# Patient Record
Sex: Male | Born: 1980 | Race: Black or African American | Hispanic: No | Marital: Single | Smoking: Never smoker
Health system: Southern US, Community
[De-identification: ages and names within clinical notes are randomized; demographics above are authoritative.]

## PROBLEM LIST (undated history)

## (undated) DIAGNOSIS — F1910 Other psychoactive substance abuse, uncomplicated: Secondary | ICD-10-CM

## (undated) DIAGNOSIS — F319 Bipolar disorder, unspecified: Secondary | ICD-10-CM

## (undated) DIAGNOSIS — F419 Anxiety disorder, unspecified: Secondary | ICD-10-CM

## (undated) DIAGNOSIS — F329 Major depressive disorder, single episode, unspecified: Secondary | ICD-10-CM

## (undated) DIAGNOSIS — K219 Gastro-esophageal reflux disease without esophagitis: Secondary | ICD-10-CM

## (undated) DIAGNOSIS — F32A Depression, unspecified: Secondary | ICD-10-CM

## (undated) HISTORY — PX: KNEE SURGERY: SHX244

## (undated) HISTORY — PX: ANKLE SURGERY: SHX546

## (undated) HISTORY — PX: KNEE ARTHROPLASTY: SHX992

---

## 2012-08-30 ENCOUNTER — Emergency Department: Payer: Self-pay | Admitting: Emergency Medicine

## 2012-11-18 ENCOUNTER — Emergency Department (HOSPITAL_COMMUNITY): Payer: Self-pay

## 2012-11-18 ENCOUNTER — Encounter (HOSPITAL_COMMUNITY): Payer: Self-pay | Admitting: Emergency Medicine

## 2012-11-18 ENCOUNTER — Emergency Department (HOSPITAL_COMMUNITY)
Admission: EM | Admit: 2012-11-18 | Discharge: 2012-11-18 | Disposition: A | Discharge status: Home / Self Care | Payer: Self-pay | Attending: Emergency Medicine | Admitting: Emergency Medicine

## 2012-11-18 DIAGNOSIS — S61259A Open bite of unspecified finger without damage to nail, initial encounter: Secondary | ICD-10-CM

## 2012-11-18 DIAGNOSIS — W540XXA Bitten by dog, initial encounter: Secondary | ICD-10-CM | POA: Insufficient documentation

## 2012-11-18 DIAGNOSIS — Y9289 Other specified places as the place of occurrence of the external cause: Secondary | ICD-10-CM | POA: Insufficient documentation

## 2012-11-18 DIAGNOSIS — S61209A Unspecified open wound of unspecified finger without damage to nail, initial encounter: Secondary | ICD-10-CM | POA: Insufficient documentation

## 2012-11-18 DIAGNOSIS — Y9389 Activity, other specified: Secondary | ICD-10-CM | POA: Insufficient documentation

## 2012-11-18 DIAGNOSIS — Y990 Civilian activity done for income or pay: Secondary | ICD-10-CM | POA: Insufficient documentation

## 2012-11-18 MED ORDER — HYDROCODONE-ACETAMINOPHEN 5-325 MG PO TABS: 1.0000 | ORAL_TABLET | ORAL | Status: DC | PRN

## 2012-11-18 MED ORDER — AMOXICILLIN-POT CLAVULANATE 875-125 MG PO TABS
1.0000 | ORAL_TABLET | Freq: Once | ORAL | Status: AC
  Administered 2012-11-18: 1 via ORAL
  Filled 2012-11-18: qty 1

## 2012-11-18 MED ORDER — HYDROCODONE-ACETAMINOPHEN 5-325 MG PO TABS
1.0000 | ORAL_TABLET | Freq: Once | ORAL | Status: AC
  Administered 2012-11-18: 1 via ORAL
  Filled 2012-11-18: qty 1

## 2012-11-18 MED ORDER — AMOXICILLIN-POT CLAVULANATE 875-125 MG PO TABS: 1.0000 | ORAL_TABLET | Freq: Two times a day (BID) | ORAL | Status: DC

## 2012-11-18 NOTE — ED Notes (Signed)
PT. REPORTS DOG BITE TO RIGHT LATERAL MIDDLE FINGER TODAY AT AN [employer] WHERE HE WORKS , UNSURE IF DOG IS IMMUNIZED , NO BLEEDING . SLIGHT SWELLING .

## 2012-11-18 NOTE — Progress Notes (Signed)
Orthopedic Tech Progress Note Patient Details:  Gabriel Barnes Jan 09, 1981 [id number]  Patient ID: Gabriel Barnes, male   DOB: [birth year], 31 y.o.   MRN: [id number] Finger splint order cancelled at request of doctor.  Nikki Dom 11/18/2012, 8:46 PM

## 2012-11-18 NOTE — ED Provider Notes (Signed)
History     CSN: [id number]  Arrival date & time 11/18/12  1907   First MD Initiated Contact with Patient 11/18/12 2005      Chief Complaint  Patient presents with  . Animal Bite    (Consider location/radiation/quality/duration/timing/severity/associated sxs/prior treatment) HPI History provided by pt.   Pt was bitten by a dog on right middle finger at animal [facility], where he works, today.  Has 8/10 pain.  Has applied antibiotic ointment.  Most recent tetanus 3 years ago.  Dog currently quarantined though he is not sure of his vaccination status.   History reviewed. No pertinent past medical history.  Past Surgical History  Procedure Date  . Knee arthroplasty   . Ankle surgery     No family history on file.  History  Substance Use Topics  . Smoking status: Never Smoker   . Smokeless tobacco: Not on file  . Alcohol Use: No      Review of Systems  All other systems reviewed and are negative.    Allergies  Review of patient's allergies indicates no known allergies.  Home Medications   Current Outpatient Rx  Name  Route  Sig  Dispense  Refill  . AMOXICILLIN-POT CLAVULANATE 875-125 MG PO TABS   Oral   Take 1 tablet by mouth 2 (two) times daily.   13 tablet   0   . HYDROCODONE-ACETAMINOPHEN 5-325 MG PO TABS   Oral   Take 1 tablet by mouth every 4 (four) hours as needed for pain.   20 tablet   0     BP 120/85  Pulse 85  Temp 97.9 F (36.6 C) (Oral)  Resp 18  SpO2 100%  Physical Exam  Nursing note and vitals reviewed. Constitutional: He is oriented to person, place, and time. He appears well-developed and well-nourished. No distress.  HENT:  Head: Normocephalic and atraumatic.  Eyes:       Normal appearance  Neck: Normal range of motion.  Pulmonary/Chest: Effort normal.  Musculoskeletal: Normal range of motion.       subq puncture wound right middle finger at lateral aspect of distal proximal phalanx.  No edema,. erythema or drainage.  Pain w/  ROM of PIP joint.  Distal sensation intact.   Neurological: He is alert and oriented to person, place, and time.  Psychiatric: He has a normal mood and affect. His behavior is normal.    ED Course  Procedures (including critical care time)  Labs Reviewed - No data to display Dg Finger Middle Right  11/18/2012  *RADIOLOGY REPORT*  Clinical Data: Dog bite right middle finger  RIGHT MIDDLE FINGER 2+V  Comparison: None.  Findings: No evidence of fracture, dislocation or radiopaque foreign object.  There is a small periarticular calcification dorsal to the DIP joint, not acute.  IMPRESSION: No acute finding by radiography.   Original Report Authenticated By: Paulina Fusi, M.D.      1. Animal bite of finger       MDM  Healthy 31yo M presents w/ animal bite to right middle finger that occurred at work.  Vaccination status of animal unknown but he is currently being quarantined.  Wound cleaned and wrapped in gauze by nursing staff.  Tetanus is up to date.  Pt received one dose of augmentin and d/c'd home w/ same + vicodin for pain.  Strict return precautions discussed.          Otilio Miu, PA-C 11/19/12 0007  Otilio Miu, PA-C 11/19/12 [phone]

## 2012-11-18 NOTE — ED Notes (Signed)
PT works at the [employer] and was bit by a  Dog at [employer] . The dog being held with  possible Rabies.

## 2012-11-18 NOTE — ED Notes (Signed)
Pt has swelling to middle finger of right hand after chihuahua bit him. Pt requesting rabies shot. A&Ox4, ambulatory, nad.

## 2012-11-18 NOTE — ED Notes (Signed)
Pt wound cleaned with dermal wound cleanser spray. Affected middle finger wrapped with gauze.

## 2012-11-19 NOTE — ED Provider Notes (Signed)
Medical screening examination/treatment/procedure(s) were performed by non-physician practitioner and as supervising physician I was immediately available for consultation/collaboration.  Flint Melter, MD 11/19/12 1215

## 2012-12-02 ENCOUNTER — Encounter (HOSPITAL_COMMUNITY): Payer: Self-pay | Admitting: *Deleted

## 2012-12-02 ENCOUNTER — Emergency Department (HOSPITAL_COMMUNITY)
Admission: EM | Admit: 2012-12-02 | Discharge: 2012-12-02 | Disposition: A | Discharge status: Home / Self Care | Payer: Self-pay | Attending: Emergency Medicine | Admitting: Emergency Medicine

## 2012-12-02 DIAGNOSIS — IMO0001 Reserved for inherently not codable concepts without codable children: Secondary | ICD-10-CM | POA: Insufficient documentation

## 2012-12-02 DIAGNOSIS — R51 Headache: Secondary | ICD-10-CM | POA: Insufficient documentation

## 2012-12-02 DIAGNOSIS — Z79899 Other long term (current) drug therapy: Secondary | ICD-10-CM | POA: Insufficient documentation

## 2012-12-02 DIAGNOSIS — R0982 Postnasal drip: Secondary | ICD-10-CM | POA: Insufficient documentation

## 2012-12-02 DIAGNOSIS — J069 Acute upper respiratory infection, unspecified: Secondary | ICD-10-CM | POA: Insufficient documentation

## 2012-12-02 DIAGNOSIS — J3489 Other specified disorders of nose and nasal sinuses: Secondary | ICD-10-CM | POA: Insufficient documentation

## 2012-12-02 DIAGNOSIS — R5383 Other fatigue: Secondary | ICD-10-CM | POA: Insufficient documentation

## 2012-12-02 DIAGNOSIS — R5381 Other malaise: Secondary | ICD-10-CM | POA: Insufficient documentation

## 2012-12-02 DIAGNOSIS — R0981 Nasal congestion: Secondary | ICD-10-CM

## 2012-12-02 DIAGNOSIS — Z792 Long term (current) use of antibiotics: Secondary | ICD-10-CM | POA: Insufficient documentation

## 2012-12-02 MED ORDER — IBUPROFEN 400 MG PO TABS
800.0000 mg | ORAL_TABLET | Freq: Once | ORAL | Status: AC
  Administered 2012-12-02: 800 mg via ORAL
  Filled 2012-12-02: qty 2

## 2012-12-02 MED ORDER — GUAIFENESIN ER 600 MG PO TB12: 1200.0000 mg | ORAL_TABLET | Freq: Two times a day (BID) | ORAL | Status: DC

## 2012-12-02 MED ORDER — IPRATROPIUM BROMIDE 0.03 % NA SOLN: 2.0000 | Freq: Two times a day (BID) | NASAL | Status: DC

## 2012-12-02 NOTE — ED Notes (Signed)
Patient reports onset of cold sx and he cannot sleep for a couple of days.  He states he has nasal congestion/dryness and he cannot breathe.  Patient denies cough.  Patient denies fever.  Patient states he is having body aches.  Patient tried dayquil w/o relief.  He denies sore throat

## 2012-12-02 NOTE — ED Provider Notes (Signed)
Medical screening examination/treatment/procedure(s) were performed by non-physician practitioner and as supervising physician I was immediately available for consultation/collaboration.   Monta Police, MD 12/02/12 1656 

## 2012-12-02 NOTE — ED Provider Notes (Signed)
History     CSN: [id number]  Arrival date & time 12/02/12  0911   First MD Initiated Contact with Patient 12/02/12 [phone]      Chief Complaint  Patient presents with  . URI    (Consider location/radiation/quality/duration/timing/severity/associated sxs/prior treatment) The history is provided by the patient and medical records.    Gabriel Barnes is a 32 y.o. male  With no known medical hx presents to the Emergency Department complaining of gradual, persistent, progressively worsening nasal congestion and headache onset 3 days ago.  Pt states he has been around people who have a "head cold" and he thinks he caught it.  Associated symptoms include headache, difficulty breathing through his nose, difficulty sleeping, myalgias, mild diarrhea.  Nothing makes it better and nothing makes it worse.  Pt denies fever, chills, sore throat, chest congestion, cough, abdominal pain, nausea, vomiting, weakness, dizziness, syncope, dysuria.      History reviewed. No pertinent past medical history.  Past Surgical History  Procedure Date  . Knee arthroplasty   . Ankle surgery     No family history on file.  History  Substance Use Topics  . Smoking status: Never Smoker   . Smokeless tobacco: Not on file  . Alcohol Use: No      Review of Systems  Constitutional: Positive for fatigue. Negative for fever, chills and appetite change.  HENT: Positive for congestion and sinus pressure. Negative for sore throat, rhinorrhea, mouth sores, neck stiffness and postnasal drip.   Eyes: Negative for visual disturbance.  Respiratory: Negative for cough, chest tightness, shortness of breath, wheezing and stridor.   Cardiovascular: Negative for chest pain, palpitations and leg swelling.  Gastrointestinal: Negative for nausea, vomiting, abdominal pain and diarrhea.  Genitourinary: Negative for dysuria, urgency, frequency and hematuria.  Musculoskeletal: Positive for myalgias. Negative for back pain and  arthralgias.  Skin: Negative for rash.  Neurological: Positive for headaches. Negative for syncope, light-headedness and numbness.  Hematological: Negative for adenopathy.  Psychiatric/Behavioral: The patient is not nervous/anxious.   All other systems reviewed and are negative.    Allergies  Review of patient's allergies indicates no known allergies.  Home Medications   Current Outpatient Rx  Name  Route  Sig  Dispense  Refill  . AMOXICILLIN-POT CLAVULANATE 875-125 MG PO TABS   Oral   Take 1 tablet by mouth 2 (two) times daily.   13 tablet   0   . ADULT MULTIVITAMIN W/MINERALS CH   Oral   Take 1 tablet by mouth daily.         . DAYQUIL PO   Oral   Take by mouth.         . GUAIFENESIN ER 600 MG PO TB12   Oral   Take 2 tablets (1,200 mg total) by mouth 2 (two) times daily.   20 tablet   0   . IPRATROPIUM BROMIDE 0.03 % NA SOLN   Nasal   Place 2 sprays into the nose every 12 (twelve) hours.   30 mL   12     BP 115/74  Pulse 64  Temp 97 F (36.1 C) (Oral)  Resp 16  Ht 6\' 1"  (1.854 m)  Wt 265 lb (120.203 kg)  BMI 34.96 kg/m2  SpO2 97%  Physical Exam  Nursing note and vitals reviewed. Constitutional: He is oriented to person, place, and time. He appears well-developed and well-nourished. No distress.  HENT:  Head: Normocephalic and atraumatic.  Right Ear: Tympanic membrane, external ear and ear  canal normal.  Left Ear: Tympanic membrane, external ear and ear canal normal.  Nose: Mucosal edema and rhinorrhea present. No epistaxis. Right sinus exhibits no maxillary sinus tenderness and no frontal sinus tenderness. Left sinus exhibits no maxillary sinus tenderness and no frontal sinus tenderness.  Mouth/Throat: Uvula is midline, oropharynx is clear and moist and mucous membranes are normal. Mucous membranes are not pale and not cyanotic. No oropharyngeal exudate, posterior oropharyngeal edema, posterior oropharyngeal erythema or tonsillar abscesses.  Eyes:  Conjunctivae normal are normal. Pupils are equal, round, and reactive to light.  Neck: Normal range of motion and full passive range of motion without pain.  Cardiovascular: Normal rate, normal heart sounds and intact distal pulses.  Exam reveals no gallop and no friction rub.   No murmur heard. Pulmonary/Chest: Effort normal and breath sounds normal. No stridor. No respiratory distress. He has no wheezes. He has no rales. He exhibits no tenderness.  Abdominal: Soft. Bowel sounds are normal. He exhibits no distension and no mass. There is no tenderness. There is no rebound and no guarding.  Musculoskeletal: Normal range of motion. He exhibits no tenderness.  Lymphadenopathy:    He has no cervical adenopathy.  Neurological: He is alert and oriented to person, place, and time. He exhibits normal muscle tone. Coordination normal.  Skin: Skin is warm and dry. No rash noted. He is not diaphoretic.  Psychiatric: He has a normal mood and affect.    ED Course  Procedures (including critical care time)  Labs Reviewed - No data to display No results found.   1. Viral URI   2. Sinus congestion   3. Postnasal drip       MDM  [name] presents with symptoms of a URI without pulmonary involvement.  Pt CXR negative for acute infiltrate. Patients symptoms are consistent with URI, likely viral etiology. Discussed that antibiotics are not indicated for viral infections. Pt will be discharged with symptomatic treatment.  Verbalizes understanding and is agreeable with plan. Pt is hemodynamically stable & in NAD prior to dc.   1. Medications: Atrovent NS, mucinex , usual home medications 2. Treatment: rest, drink plenty of fluids, cool mist humidifier, warm saline flushes of the nose, and ibuprofen for myalgias and fever control 3. Follow Up: Please followup with your primary doctor for discussion of your diagnoses and further evaluation after today's visit; if you do not have a primary care  doctor use the resource guide provided to find one        Dierdre Forth, PA-C 12/02/12 1053

## 2013-02-15 ENCOUNTER — Encounter (HOSPITAL_COMMUNITY): Payer: Self-pay | Admitting: Emergency Medicine

## 2013-02-15 ENCOUNTER — Emergency Department (INDEPENDENT_AMBULATORY_CARE_PROVIDER_SITE_OTHER)
Admission: EM | Admit: 2013-02-15 | Discharge: 2013-02-15 | Disposition: A | Discharge status: Home / Self Care | Payer: Self-pay | Source: Home / Self Care | Attending: Family Medicine | Admitting: Family Medicine

## 2013-02-15 DIAGNOSIS — R42 Dizziness and giddiness: Secondary | ICD-10-CM

## 2013-02-15 NOTE — ED Provider Notes (Addendum)
History     CSN: [id number]  Arrival date & time 02/15/13  1451   First MD Initiated Contact with Patient 02/15/13 1659      Chief Complaint  Patient presents with  . Dizziness    (Consider location/radiation/quality/duration/timing/severity/associated sxs/prior treatment) Patient is a 32 y.o. male presenting with neurologic complaint. The history is provided by the patient.  Neurologic Problem The primary symptoms include dizziness. Primary symptoms do not include headaches, syncope, loss of consciousness, focal weakness, fever, nausea or vomiting. The symptoms began yesterday. The symptoms are improving. 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  Dizziness does not occur with nausea, vomiting or weakness.  Additional symptoms do not include weakness, pain, nystagmus, hearing loss or vertigo.    History reviewed. No pertinent past medical history.  Past Surgical History  Procedure Laterality Date  . Knee arthroplasty    . Ankle surgery      No family history on file.  History  Substance Use Topics  . Smoking status: Never Smoker   . Smokeless tobacco: Not on file  . Alcohol Use: No      Review of Systems  Constitutional: Negative.  Negative for fever.  HENT: Negative for hearing loss.   Respiratory: Negative.   Cardiovascular: Negative.  Negative for syncope.  Gastrointestinal: Negative.  Negative for nausea and vomiting.  Genitourinary: Negative.   Neurological: Positive for dizziness and light-headedness. Negative for vertigo, focal weakness, loss of consciousness, weakness and headaches.    Allergies  Review of patient's allergies indicates no known allergies.  Home Medications   Current Outpatient Rx  Name  Route  Sig  Dispense  Refill  . amoxicillin-clavulanate (AUGMENTIN) 875-125 MG per tablet   Oral   Take 1 tablet by mouth 2 (two) times daily.   13 tablet  0   . guaiFENesin (MUCINEX) 600 MG 12 hr tablet   Oral   Take 2 tablets (1,200 mg total) by mouth 2 (two) times daily.   20 tablet   0   . ipratropium (ATROVENT) 0.03 % nasal spray   Nasal   Place 2 sprays into the nose every 12 (twelve) hours.   30 mL   12   . Multiple Vitamin (MULTIVITAMIN WITH MINERALS) TABS   Oral   Take 1 tablet by mouth daily.         . Pseudoephedrine-APAP-DM (DAYQUIL PO)   Oral   Take by mouth.           BP 122/79  Pulse 80  Temp(Src) 98.5 F (36.9 C) (Oral)  Wt 259 lb (117.482 kg)  BMI 34.18 kg/m2  SpO2 100%  Physical Exam  Nursing note and vitals reviewed. Constitutional: He is oriented to person, place, and time. He appears well-developed and well-nourished.  HENT:  Head: Normocephalic.  Right Ear: External ear normal.  Left Ear: External ear normal.  Mouth/Throat: Oropharynx is clear and moist.  Neck: Normal range of motion. Neck supple.  Neurological: He is alert and oriented to person, place, and time. He has normal strength. No cranial nerve deficit or sensory deficit. He displays a negative Romberg sign. Coordination and gait normal.  Skin: Skin is warm and dry.    ED Course  Procedures (including critical care time)  Labs Reviewed - No data to display No results found.   1. Episodic lightheadedness       MDM  Linna Hoff, MD 02/15/13 1720  Linna Hoff, MD 02/15/13 [phone]

## 2013-02-15 NOTE — ED Notes (Signed)
Pt c/o dizziness and light headedness x 3 weeks. No history of high blood pressure. Patient is alert and oriented without any signs of distress.

## 2013-03-19 ENCOUNTER — Emergency Department (INDEPENDENT_AMBULATORY_CARE_PROVIDER_SITE_OTHER)
Admission: EM | Admit: 2013-03-19 | Discharge: 2013-03-19 | Disposition: A | Discharge status: Home / Self Care | Payer: Self-pay | Source: Home / Self Care | Attending: Emergency Medicine | Admitting: Emergency Medicine

## 2013-03-19 ENCOUNTER — Emergency Department (INDEPENDENT_AMBULATORY_CARE_PROVIDER_SITE_OTHER): Payer: Self-pay

## 2013-03-19 ENCOUNTER — Encounter (HOSPITAL_COMMUNITY): Payer: Self-pay | Admitting: *Deleted

## 2013-03-19 DIAGNOSIS — M7650 Patellar tendinitis, unspecified knee: Secondary | ICD-10-CM

## 2013-03-19 DIAGNOSIS — M7652 Patellar tendinitis, left knee: Secondary | ICD-10-CM

## 2013-03-19 MED ORDER — IBUPROFEN 800 MG PO TABS
ORAL_TABLET | ORAL | Status: AC
  Filled 2013-03-19: qty 1

## 2013-03-19 MED ORDER — DICLOFENAC SODIUM 75 MG PO TBEC: 75.0000 mg | DELAYED_RELEASE_TABLET | Freq: Two times a day (BID) | ORAL | Status: DC

## 2013-03-19 MED ORDER — IBUPROFEN 800 MG PO TABS
800.0000 mg | ORAL_TABLET | Freq: Once | ORAL | Status: AC
  Administered 2013-03-19: 800 mg via ORAL
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
## 2013-03-19 NOTE — ED Notes (Signed)
Patient complain of left knee pain after switching work boots x 2 days.

## 2013-05-07 ENCOUNTER — Emergency Department (HOSPITAL_COMMUNITY)
Admission: EM | Admit: 2013-05-07 | Discharge: 2013-05-07 | Disposition: A | Discharge status: Home / Self Care | Payer: Self-pay | Attending: Emergency Medicine | Admitting: Emergency Medicine

## 2013-05-07 ENCOUNTER — Encounter (HOSPITAL_COMMUNITY): Payer: Self-pay | Admitting: *Deleted

## 2013-05-07 ENCOUNTER — Emergency Department (HOSPITAL_COMMUNITY): Payer: Self-pay

## 2013-05-07 DIAGNOSIS — R300 Dysuria: Secondary | ICD-10-CM | POA: Insufficient documentation

## 2013-05-07 DIAGNOSIS — Z79899 Other long term (current) drug therapy: Secondary | ICD-10-CM | POA: Insufficient documentation

## 2013-05-07 DIAGNOSIS — R109 Unspecified abdominal pain: Secondary | ICD-10-CM | POA: Insufficient documentation

## 2013-05-07 LAB — POCT I-STAT, CHEM 8
BUN: 13 mg/dL (ref 6–23)
Calcium, Ion: 1.28 mmol/L — ABNORMAL HIGH (ref 1.12–1.23)
Creatinine, Ser: 1 mg/dL (ref 0.50–1.35)
TCO2: 28 mmol/L (ref 0–100)

## 2013-05-07 LAB — URINALYSIS, ROUTINE W REFLEX MICROSCOPIC
Bilirubin Urine: NEGATIVE
Glucose, UA: NEGATIVE mg/dL
Hgb urine dipstick: NEGATIVE
Ketones, ur: 15 mg/dL — AB
Protein, ur: NEGATIVE mg/dL

## 2013-05-07 MED ORDER — PHENAZOPYRIDINE HCL 200 MG PO TABS: 200.0000 mg | ORAL_TABLET | Freq: Three times a day (TID) | ORAL | Status: DC | PRN

## 2013-05-07 MED ORDER — OXYCODONE-ACETAMINOPHEN 5-325 MG PO TABS
1.0000 | ORAL_TABLET | Freq: Once | ORAL | Status: AC
  Administered 2013-05-07: 1 via ORAL
  Filled 2013-05-07: qty 1

## 2013-05-07 MED ORDER — OXYCODONE-ACETAMINOPHEN 5-325 MG PO TABS: 1.0000 | ORAL_TABLET | ORAL | Status: DC | PRN

## 2013-05-07 NOTE — ED Provider Notes (Signed)
History     CSN: [id number]  Arrival date & time 05/07/13  1625   First MD Initiated Contact with Patient 05/07/13 1805      Chief Complaint  Patient presents with  . Flank Pain    (Consider location/radiation/quality/duration/timing/severity/associated sxs/prior treatment) HPI Comments: Pt presents to the ED for intermittent, sharp, non-radiating, left flank pain x 3 days.  States pain associated with dysuria.  Denies any hematuria or increased urinary frequency.  Prior episodes of similar pain- patient was told at that time he was dehydrated. Patient notes he does not drink a lot of water, he drinks a lot of soda, coffee, and tea.  No hx of renal stones.  No new sexual contacts, penile discharge, or concern for STDs at this time.  No recent fever sweats or chills.  No chest pain, SOB, abdominal pain, nausea, vomiting, or diarrhea.  The history is provided by the patient.    History reviewed. No pertinent past medical history.  Past Surgical History  Procedure Laterality Date  . Knee arthroplasty    . Ankle surgery      History reviewed. No pertinent family history.  History  Substance Use Topics  . Smoking status: Never Smoker   . Smokeless tobacco: Not on file  . Alcohol Use: No      Review of Systems  Genitourinary: Positive for dysuria and flank pain.  All other systems reviewed and are negative.    Allergies  Review of patient's allergies indicates no known allergies.  Home Medications   Current Outpatient Rx  Name  Route  Sig  Dispense  Refill  . amoxicillin-clavulanate (AUGMENTIN) 875-125 MG per tablet   Oral   Take 1 tablet by mouth 2 (two) times daily.   13 tablet   0   . diclofenac (VOLTAREN) 75 MG EC tablet   Oral   Take 1 tablet (75 mg total) by mouth 2 (two) times daily.   20 tablet   0   . guaiFENesin (MUCINEX) 600 MG 12 hr tablet   Oral   Take 2 tablets (1,200 mg total) by mouth 2 (two) times daily.   20 tablet   0   . ipratropium  (ATROVENT) 0.03 % nasal spray   Nasal   Place 2 sprays into the nose every 12 (twelve) hours.   30 mL   12   . Multiple Vitamin (MULTIVITAMIN WITH MINERALS) TABS   Oral   Take 1 tablet by mouth daily.         . Pseudoephedrine-APAP-DM (DAYQUIL PO)   Oral   Take by mouth.           BP 115/64  Pulse 64  Temp(Src) 98.3 F (36.8 C) (Oral)  Resp 20  Ht 6' (1.829 m)  Wt 250 lb (113.399 kg)  BMI 33.9 kg/m2  SpO2 100%  Physical Exam  Nursing note and vitals reviewed. Constitutional: He is oriented to person, place, and time. He appears well-developed and well-nourished. No distress.  HENT:  Head: Normocephalic and atraumatic.  Eyes: Conjunctivae and EOM are normal.  Neck: Normal range of motion. Neck supple.  Cardiovascular: Normal rate, regular rhythm and normal heart sounds.   Pulmonary/Chest: Effort normal and breath sounds normal. No respiratory distress.  Abdominal: Soft. Bowel sounds are normal. There is no tenderness. There is CVA tenderness (left). There is no guarding, no tenderness at McBurney's point and negative Murphy's sign.  Musculoskeletal: Normal range of motion. He exhibits no edema.  Neurological: He is  alert and oriented to person, place, and time.  Skin: Skin is warm and dry.  Psychiatric: He has a normal mood and affect.    ED Course  Procedures (including critical care time)  Labs Reviewed  URINALYSIS, ROUTINE W REFLEX MICROSCOPIC - Abnormal; Notable for the following:    Specific Gravity, Urine 1.031 (*)    Ketones, ur 15 (*)    All other components within normal limits  POCT I-STAT, CHEM 8 - Abnormal; Notable for the following:    Calcium, Ion 1.28 (*)    All other components within normal limits   Ct Abdomen Pelvis Wo Contrast  05/07/2013   *RADIOLOGY REPORT*  Clinical Data: Left flank pain  CT ABDOMEN AND PELVIS WITHOUT CONTRAST  Technique:  Multidetector CT imaging of the abdomen and pelvis was performed following the standard protocol  without intravenous contrast.  Comparison: None.  Findings: The lung bases are clear.  Liver gallbladder and bile ducts are normal. Small liver calcification is present.  Pancreas and spleen are normal.  Small splenic calcification is noted.  Negative for urinary tract calculi.  No renal obstruction or mass.  Negative for bowel obstruction or bowel thickening.  Appendix is normal.  IMPRESSION: Negative   Original Report Authenticated By: Janeece Riggers, M.D.     1. Flank pain   2. Dysuria       MDM   CT abdomen and pelvis negative for renal stones. UA without signs of infection, mild dehydration.  Pain controlled with PO percocet.  Pt afebrile, non-toxic appearing, NAD, VS stable- ok for d/c.  Rx Percocet and Pyridium. Discussed plan with patient, he agreed. Return precautions advised.        Garlon Hatchet, PA-C 05/07/13 2249

## 2013-05-07 NOTE — ED Notes (Signed)
Pt alert, NAD, calm, interactive, resps e/u, back from radiology.

## 2013-05-07 NOTE — ED Notes (Signed)
Pt in c/o left flank pain, states pain x3 days, also c/o pain with urination, states last time this happened he was told he was dehydrated

## 2013-05-08 NOTE — ED Provider Notes (Signed)
Medical screening examination/treatment/procedure(s) were performed by non-physician practitioner and as supervising physician I was immediately available for consultation/collaboration.   Loren Racer, MD 05/08/13 [phone]

## 2013-05-22 ENCOUNTER — Encounter (HOSPITAL_COMMUNITY): Payer: Self-pay | Admitting: Emergency Medicine

## 2013-05-22 ENCOUNTER — Emergency Department (HOSPITAL_COMMUNITY)
Admission: EM | Admit: 2013-05-22 | Discharge: 2013-05-22 | Discharge status: Against Medical Advice | Payer: Self-pay | Attending: Emergency Medicine | Admitting: Emergency Medicine

## 2013-05-22 DIAGNOSIS — M25519 Pain in unspecified shoulder: Secondary | ICD-10-CM | POA: Insufficient documentation

## 2013-05-22 NOTE — ED Notes (Signed)
Called x2 for room placement

## 2013-05-22 NOTE — ED Notes (Signed)
PT. REPORTS RIGHT SHOULDER MUSCLE ACHE / RIGHT UPPER BACK PAIN FOR 1 WEEK AFTER HEAVY WEIGHT LIFTING , DENIES FALL OR INJURY.

## 2013-06-04 ENCOUNTER — Encounter (HOSPITAL_COMMUNITY): Payer: Self-pay | Admitting: *Deleted

## 2013-06-04 ENCOUNTER — Emergency Department (INDEPENDENT_AMBULATORY_CARE_PROVIDER_SITE_OTHER)
Admission: EM | Admit: 2013-06-04 | Discharge: 2013-06-04 | Disposition: A | Discharge status: Home / Self Care | Payer: Self-pay | Source: Home / Self Care | Attending: Emergency Medicine | Admitting: Emergency Medicine

## 2013-06-04 ENCOUNTER — Emergency Department (INDEPENDENT_AMBULATORY_CARE_PROVIDER_SITE_OTHER): Payer: Self-pay

## 2013-06-04 DIAGNOSIS — M7581 Other shoulder lesions, right shoulder: Secondary | ICD-10-CM

## 2013-06-04 DIAGNOSIS — M67919 Unspecified disorder of synovium and tendon, unspecified shoulder: Secondary | ICD-10-CM

## 2013-06-04 MED ORDER — IBUPROFEN 800 MG PO TABS
ORAL_TABLET | ORAL | Status: AC
  Filled 2013-06-04: qty 1

## 2013-06-04 MED ORDER — METHYLPREDNISOLONE ACETATE 80 MG/ML IJ SUSP
80.0000 mg | Freq: Once | INTRAMUSCULAR | Status: AC
  Administered 2013-06-04: 80 mg via INTRAMUSCULAR

## 2013-06-04 MED ORDER — HYDROCODONE-ACETAMINOPHEN 5-325 MG PO TABS
ORAL_TABLET | ORAL | Status: AC
  Filled 2013-06-04: qty 1

## 2013-06-04 MED ORDER — METHYLPREDNISOLONE ACETATE 80 MG/ML IJ SUSP
INTRAMUSCULAR | Status: AC
  Filled 2013-06-04: qty 1

## 2013-06-04 MED ORDER — HYDROCODONE-ACETAMINOPHEN 5-325 MG PO TABS: ORAL_TABLET | ORAL | Status: DC

## 2013-06-04 MED ORDER — HYDROCODONE-ACETAMINOPHEN 5-325 MG PO TABS
1.0000 | ORAL_TABLET | Freq: Once | ORAL | Status: AC
  Administered 2013-06-04: 1 via ORAL

## 2013-06-04 MED ORDER — IBUPROFEN 800 MG PO TABS
800.0000 mg | ORAL_TABLET | Freq: Once | ORAL | Status: AC
  Administered 2013-06-04: 800 mg via ORAL

## 2013-06-04 MED ORDER — MELOXICAM 15 MG PO TABS: 15.0000 mg | ORAL_TABLET | Freq: Every day | ORAL | Status: DC

## 2013-06-04 NOTE — ED Notes (Signed)
Xl  r  Arm  /  Sling   Applied

## 2013-06-04 NOTE — ED Notes (Signed)
Pt  Reports  inj  r  Shoulder  sev  Weeks  Ago  While  Lifting   Pain is  Worse  On  Certain movements            denys  Any other  Symptoms

## 2013-06-04 NOTE — ED Provider Notes (Signed)
Chief Complaint:   Chief Complaint  Patient presents with  . Shoulder Pain    History of Present Illness:   Gabriel Barnes is a 32 year old male who has had a seven-day day history of right shoulder pain. This occurred after lifting weights. It is located posteriorly. It hurts with certain positions. He has difficulty with abduction. It hurts to sleep on that side at night. The shoulder feels weak. The pain does not radiate down below the elbow. He denies any numbness or tingling in the arm. He denies any neck pain.  Review of Systems:  Other than noted above, the patient denies any of the following symptoms: Systemic:  No fevers, chills, sweats, or aches.  No fatigue or tiredness. Musculoskeletal:  No joint pain, arthritis, bursitis, swelling, back pain, or neck pain. Neurological:  No muscular weakness, paresthesias, headache, or trouble with speech or coordination.  No dizziness.  PMFSH:  Past medical history, family history, social history, meds, and allergies were reviewed.   Physical Exam:   Vital signs:  BP 123/67  Pulse 72  Temp(Src) 98.6 F (37 C) (Oral)  Resp 16  SpO2 100% Gen:  Alert and oriented times 3.  In no distress. Musculoskeletal: No pain to palpation. No visible swelling. The shoulder has a full range of motion with pain on abduction. Neer test was negative.  Hawkins test was negative.  Empty cans test was negative. Otherwise, all joints had a full a ROM with no swelling, bruising or deformity.  No edema, pulses full. Extremities were warm and pink.  Capillary refill was brisk.  Skin:  Clear, warm and dry.  No rash. Neuro:  Alert and oriented times 3.  Muscle strength was normal.  Sensation was intact to light touch.   Radiology:  Dg Shoulder Right  06/04/2013   *RADIOLOGY REPORT*  Clinical Data: Right shoulder pain for 1 week.  No known injury.  RIGHT SHOULDER - 2+ VIEW  Comparison: None.  Findings: The humerus is located and the acromioclavicular joint is intact.   No notable degenerative change is present about the shoulder.  There is no fracture.  Imaged right lung and ribs appear normal.  IMPRESSION: Negative exam.   Original Report Authenticated By: Holley Dexter, M.D.   I reviewed the images independently and personally and concur with the radiologist's findings.  Course in Urgent Care Center:   He was placed in a sling, although suggested he wear this for no more than 3 days, then start exercises. He was given Depo-Medrol 80 mg IM and for pain Norco 5/325 one by mouth and ibuprofen 800 mg by mouth.  Assessment:  The encounter diagnosis was Rotator cuff tendonitis, right.  Differential diagnosis is rotator cuff tendinitis, rotator cuff tear, or labral tear.  Plan:   1.  The following meds were prescribed:   Discharge Medication List as of 06/04/2013  2:25 PM    START taking these medications   Details  HYDROcodone-acetaminophen (NORCO/VICODIN) 5-325 MG per tablet 1 to 2 tabs every 4 to 6 hours as needed for pain., Print    meloxicam (MOBIC) 15 MG tablet Take 1 tablet (15 mg total) by mouth daily., Starting 06/04/2013, Until Discontinued, Normal       2.  The patient was instructed in symptomatic care, including rest and activity, elevation, application of ice and compression.  Appropriate handouts were given. 3.  The patient was told to return if becoming worse in any way, if no better in 3 or 4 days, and  given some red flag symptoms such as worsening in pain or neurological symptoms that would indicate earlier return.   4.  The patient was told to follow up with Dr. Mckinley Jewel in one week.    Reuben Likes, MD 06/04/13 2002

## 2013-08-19 ENCOUNTER — Emergency Department (HOSPITAL_COMMUNITY)
Admission: EM | Admit: 2013-08-19 | Discharge: 2013-08-19 | Disposition: A | Discharge status: Home / Self Care | Payer: Self-pay | Attending: Emergency Medicine | Admitting: Emergency Medicine

## 2013-08-19 ENCOUNTER — Encounter (HOSPITAL_COMMUNITY): Payer: Self-pay | Admitting: Emergency Medicine

## 2013-08-19 ENCOUNTER — Emergency Department (HOSPITAL_COMMUNITY): Payer: Self-pay

## 2013-08-19 DIAGNOSIS — Y9389 Activity, other specified: Secondary | ICD-10-CM | POA: Insufficient documentation

## 2013-08-19 DIAGNOSIS — S8392XA Sprain of unspecified site of left knee, initial encounter: Secondary | ICD-10-CM

## 2013-08-19 DIAGNOSIS — Y929 Unspecified place or not applicable: Secondary | ICD-10-CM | POA: Insufficient documentation

## 2013-08-19 DIAGNOSIS — Z9889 Other specified postprocedural states: Secondary | ICD-10-CM | POA: Insufficient documentation

## 2013-08-19 DIAGNOSIS — IMO0002 Reserved for concepts with insufficient information to code with codable children: Secondary | ICD-10-CM | POA: Insufficient documentation

## 2013-08-19 DIAGNOSIS — X500XXA Overexertion from strenuous movement or load, initial encounter: Secondary | ICD-10-CM | POA: Insufficient documentation

## 2013-08-19 DIAGNOSIS — Z791 Long term (current) use of non-steroidal anti-inflammatories (NSAID): Secondary | ICD-10-CM | POA: Insufficient documentation

## 2013-08-19 MED ORDER — NAPROXEN 500 MG PO TABS: 500.0000 mg | ORAL_TABLET | Freq: Two times a day (BID) | ORAL | Status: DC

## 2013-08-19 MED ORDER — HYDROCODONE-ACETAMINOPHEN 5-325 MG PO TABS
1.0000 | ORAL_TABLET | Freq: Once | ORAL | Status: AC
  Administered 2013-08-19: 1 via ORAL
  Filled 2013-08-19: qty 1

## 2013-08-19 NOTE — ED Notes (Signed)
Patient transported to X-ray 

## 2013-08-19 NOTE — ED Notes (Addendum)
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 He took a step and twisted it. No swelling or deformity noted.
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
## 2013-08-19 NOTE — ED Provider Notes (Signed)
Medical screening examination/treatment/procedure(s) were performed by non-physician practitioner and as supervising physician I was immediately available for consultation/collaboration.  Paulmichael Schreck M Kaelob Persky, MD 08/19/13 0656 

## 2013-10-02 ENCOUNTER — Encounter (HOSPITAL_BASED_OUTPATIENT_CLINIC_OR_DEPARTMENT_OTHER): Payer: Self-pay | Admitting: Emergency Medicine

## 2013-10-02 ENCOUNTER — Emergency Department (HOSPITAL_BASED_OUTPATIENT_CLINIC_OR_DEPARTMENT_OTHER)
Admission: EM | Admit: 2013-10-02 | Discharge: 2013-10-02 | Disposition: A | Discharge status: Home / Self Care | Payer: Self-pay | Attending: Emergency Medicine | Admitting: Emergency Medicine

## 2013-10-02 DIAGNOSIS — K219 Gastro-esophageal reflux disease without esophagitis: Secondary | ICD-10-CM | POA: Insufficient documentation

## 2013-10-02 DIAGNOSIS — M25519 Pain in unspecified shoulder: Secondary | ICD-10-CM | POA: Insufficient documentation

## 2013-10-02 DIAGNOSIS — M25512 Pain in left shoulder: Secondary | ICD-10-CM

## 2013-10-02 DIAGNOSIS — G8929 Other chronic pain: Secondary | ICD-10-CM | POA: Insufficient documentation

## 2013-10-02 DIAGNOSIS — Z87891 Personal history of nicotine dependence: Secondary | ICD-10-CM | POA: Insufficient documentation

## 2013-10-02 DIAGNOSIS — Z79899 Other long term (current) drug therapy: Secondary | ICD-10-CM | POA: Insufficient documentation

## 2013-10-02 HISTORY — DX: Gastro-esophageal reflux disease without esophagitis: K21.9

## 2013-10-02 MED ORDER — HYDROCODONE-ACETAMINOPHEN 5-325 MG PO TABS: 1.0000 | ORAL_TABLET | ORAL | Status: DC | PRN

## 2013-10-02 NOTE — ED Provider Notes (Signed)
CSN: [id number]     Arrival date & time 10/02/13  1120 History   First MD Initiated Contact with Patient 10/02/13 1124     Chief Complaint  Patient presents with  . Shoulder Pain   (Consider location/radiation/quality/duration/timing/severity/associated sxs/prior Treatment) HPI Comments: Pt states that he has been having ongoing problems with the shoulder and was told that he needs an mri but he can't afford WU:[id number] numbness or weakness:pain with movement:any heavy lifting makes the pain worse  Patient is a 32 y.o. male presenting with shoulder pain. The history is provided by the patient. No language interpreter was used.  Shoulder Pain This is a chronic problem. The current episode started more than 1 month ago. The problem occurs intermittently. The problem has been unchanged. Pertinent negatives include no fever. The symptoms are aggravated by bending.    Past Medical History  Diagnosis Date  . Acid reflux    Past Surgical History  Procedure Laterality Date  . Knee arthroplasty    . Ankle surgery    . Knee surgery     History reviewed. No pertinent family history. History  Substance Use Topics  . Smoking status: Former Games developer  . Smokeless tobacco: Never Used  . Alcohol Use: Yes     Comment: socially    Review of Systems  Constitutional: Negative for fever.  Respiratory: Negative.   Cardiovascular: Negative.     Allergies  Review of patient's allergies indicates no known allergies.  Home Medications   Current Outpatient Rx  Name  Route  Sig  Dispense  Refill  . esomeprazole (NEXIUM) 40 MG capsule   Oral   Take 40 mg by mouth daily at 12 noon.         Marland Kitchen HYDROcodone-acetaminophen (NORCO/VICODIN) 5-325 MG per tablet   Oral   Take 1-2 tablets by mouth every 4 (four) hours as needed.   15 tablet   0   . naproxen (NAPROSYN) 500 MG tablet   Oral   Take 1 tablet (500 mg total) by mouth 2 (two) times daily.   30 tablet   0    BP 130/84  Pulse 77   Temp(Src) 96.3 F (35.7 C) (Oral)  Resp 20  SpO2 96% Physical Exam  Nursing note and vitals reviewed. Constitutional: He is oriented to person, place, and time. He appears well-developed and well-nourished.  Cardiovascular: Normal rate and regular rhythm.   Pulmonary/Chest: Effort normal and breath sounds normal.  Musculoskeletal:  Generalized tenderness to the left shoulder:no gross deformity;grip strength equal  Neurological: He is alert and oriented to person, place, and time.    ED Course  Procedures (including critical care time) Labs Review Labs Reviewed - No data to display Imaging Review No results found.  EKG Interpretation   None       MDM   1. Shoulder pain, left    Pt given something for pain and follow up   Teressa Lower, NP 10/02/13 1149

## 2013-10-02 NOTE — ED Notes (Signed)
Pt amb to room 7 with quick steady gait in nad. Pt reports he used to lift weights and had chronic left shoulder pain, which resolved after he stopped lifting weights. One week ago the pain returned, pt denies lifting weights or any trauma or injury.

## 2013-10-03 NOTE — ED Provider Notes (Signed)
Medical screening examination/treatment/procedure(s) were performed by non-physician practitioner and as supervising physician I was immediately available for consultation/collaboration.    Nelia Shi, MD 10/03/13 [phone]

## 2013-10-15 ENCOUNTER — Encounter (HOSPITAL_BASED_OUTPATIENT_CLINIC_OR_DEPARTMENT_OTHER): Payer: Self-pay | Admitting: Emergency Medicine

## 2013-10-15 ENCOUNTER — Emergency Department (HOSPITAL_BASED_OUTPATIENT_CLINIC_OR_DEPARTMENT_OTHER): Payer: Self-pay

## 2013-10-15 ENCOUNTER — Emergency Department (HOSPITAL_BASED_OUTPATIENT_CLINIC_OR_DEPARTMENT_OTHER)
Admission: EM | Admit: 2013-10-15 | Discharge: 2013-10-15 | Disposition: A | Discharge status: Home / Self Care | Payer: Self-pay | Attending: Emergency Medicine | Admitting: Emergency Medicine

## 2013-10-15 DIAGNOSIS — R109 Unspecified abdominal pain: Secondary | ICD-10-CM | POA: Insufficient documentation

## 2013-10-15 DIAGNOSIS — R319 Hematuria, unspecified: Secondary | ICD-10-CM | POA: Insufficient documentation

## 2013-10-15 DIAGNOSIS — Z79899 Other long term (current) drug therapy: Secondary | ICD-10-CM | POA: Insufficient documentation

## 2013-10-15 DIAGNOSIS — K219 Gastro-esophageal reflux disease without esophagitis: Secondary | ICD-10-CM | POA: Insufficient documentation

## 2013-10-15 DIAGNOSIS — Z791 Long term (current) use of non-steroidal anti-inflammatories (NSAID): Secondary | ICD-10-CM | POA: Insufficient documentation

## 2013-10-15 LAB — URINALYSIS, ROUTINE W REFLEX MICROSCOPIC
Glucose, UA: NEGATIVE mg/dL
Leukocytes, UA: NEGATIVE
Nitrite: NEGATIVE
Protein, ur: NEGATIVE mg/dL
Urobilinogen, UA: 0.2 mg/dL (ref 0.0–1.0)
pH: 6 (ref 5.0–8.0)

## 2013-10-15 LAB — BASIC METABOLIC PANEL
CO2: 28 mEq/L (ref 19–32)
Calcium: 9.4 mg/dL (ref 8.4–10.5)
Chloride: 101 mEq/L (ref 96–112)
GFR calc non Af Amer: >90 mL/min (ref >90)
Sodium: 138 mEq/L (ref 135–145)

## 2013-10-15 LAB — URINE MICROSCOPIC-ADD ON

## 2013-10-15 MED ORDER — TRAMADOL HCL 50 MG PO TABS: 50.0000 mg | ORAL_TABLET | Freq: Four times a day (QID) | ORAL | Status: DC | PRN

## 2013-10-15 MED ORDER — IBUPROFEN 600 MG PO TABS: 600.0000 mg | ORAL_TABLET | Freq: Four times a day (QID) | ORAL | Status: DC | PRN

## 2013-10-15 MED ORDER — KETOROLAC TROMETHAMINE 30 MG/ML IJ SOLN
30.0000 mg | Freq: Once | INTRAMUSCULAR | Status: AC
  Administered 2013-10-15: 30 mg via INTRAVENOUS
  Filled 2013-10-15: qty 1

## 2013-10-15 NOTE — ED Provider Notes (Signed)
CSN: [id number]     Arrival date & time 10/15/13  4098 History   First MD Initiated Contact with Patient 10/15/13 [phone]     Chief Complaint  Patient presents with  . Hematuria   (Consider location/radiation/quality/duration/timing/severity/associated sxs/prior Treatment) HPI Comments: Patient presents with right flank pain associated hematuria. He states her the last 2 or 3 days and had a little bit of burning when he pees. Today he started having pain in his right midabdomen. The pain has been sharp and constant since this morning. He also noticed some blood in his urine this morning. He denies any back pain. He denies any penile discharge. He denies any nausea vomiting or fevers. He denies any known history of kidney stones.  Patient is a 32 y.o. male presenting with hematuria.  Hematuria Associated symptoms include abdominal pain. Pertinent negatives include no chest pain, no headaches and no shortness of breath.    Past Medical History  Diagnosis Date  . Acid reflux    Past Surgical History  Procedure Laterality Date  . Knee arthroplasty    . Ankle surgery    . Knee surgery     No family history on file. History  Substance Use Topics  . Smoking status: Never Smoker   . Smokeless tobacco: Never Used  . Alcohol Use: Yes     Comment: socially    Review of Systems  Constitutional: Negative for fever, chills, diaphoresis and fatigue.  HENT: Negative for congestion, rhinorrhea and sneezing.   Eyes: Negative.   Respiratory: Negative for cough, chest tightness and shortness of breath.   Cardiovascular: Negative for chest pain and leg swelling.  Gastrointestinal: Positive for abdominal pain. Negative for nausea, vomiting, diarrhea and blood in stool.  Genitourinary: Positive for dysuria and hematuria. Negative for frequency, flank pain, penile swelling, difficulty urinating, penile pain and testicular pain.  Musculoskeletal: Negative for arthralgias and back pain.  Skin:  Negative for rash.  Neurological: Negative for dizziness, speech difficulty, weakness, numbness and headaches.    Allergies  Review of patient's allergies indicates no known allergies.  Home Medications   Current Outpatient Rx  Name  Route  Sig  Dispense  Refill  . esomeprazole (NEXIUM) 40 MG capsule   Oral   Take 40 mg by mouth daily at 12 noon.         Marland Kitchen HYDROcodone-acetaminophen (NORCO/VICODIN) 5-325 MG per tablet   Oral   Take 1-2 tablets by mouth every 4 (four) hours as needed.   15 tablet   0   . ibuprofen (ADVIL,MOTRIN) 600 MG tablet   Oral   Take 1 tablet (600 mg total) by mouth every 6 (six) hours as needed.   30 tablet   0   . naproxen (NAPROSYN) 500 MG tablet   Oral   Take 1 tablet (500 mg total) by mouth 2 (two) times daily.   30 tablet   0   . traMADol (ULTRAM) 50 MG tablet   Oral   Take 1 tablet (50 mg total) by mouth every 6 (six) hours as needed.   15 tablet   0    BP 122/75  Pulse 82  Temp(Src) 98 F (36.7 C) (Oral)  Resp 20  Ht 6\' 1"  (1.854 m)  Wt 259 lb 3 oz (117.567 kg)  BMI 34.20 kg/m2  SpO2 100% Physical Exam  Constitutional: He is oriented to person, place, and time. He appears well-developed and well-nourished.  HENT:  Head: Normocephalic and atraumatic.  Eyes: Pupils are  equal, round, and reactive to light.  Neck: Normal range of motion. Neck supple.  Cardiovascular: Normal rate, regular rhythm and normal heart sounds.   Pulmonary/Chest: Effort normal and breath sounds normal. No respiratory distress. He has no wheezes. He has no rales. He exhibits no tenderness.  Abdominal: Soft. Bowel sounds are normal. There is tenderness (+tenderness right mid abdomen/flank area). There is no rebound and no guarding.  Genitourinary: Penis normal.  No genital sores/discharge  Musculoskeletal: Normal range of motion. He exhibits no edema.  Lymphadenopathy:    He has no cervical adenopathy.  Neurological: He is alert and oriented to person,  place, and time.  Skin: Skin is warm and dry. No rash noted.  Psychiatric: He has a normal mood and affect.    ED Course  Procedures (including critical care time) Labs Review Labs Reviewed  URINALYSIS, ROUTINE W REFLEX MICROSCOPIC - Abnormal; Notable for the following:    Hgb urine dipstick TRACE (*)    All other components within normal limits  URINE MICROSCOPIC-ADD ON - Abnormal; Notable for the following:    Bacteria, UA FEW (*)    All other components within normal limits  BASIC METABOLIC PANEL   Imaging Review Ct Abdomen Pelvis Wo Contrast  10/15/2013   CLINICAL DATA:  Right-sided flank pain.  Hematuria.  EXAM: CT ABDOMEN AND PELVIS WITHOUT CONTRAST  TECHNIQUE: Multidetector CT imaging of the abdomen and pelvis was performed following the standard protocol without intravenous contrast.  COMPARISON:  CT of the abdomen and pelvis 05/07/2013.  FINDINGS: Lung Bases: Unremarkable.  Abdomen/Pelvis: There are no abnormal calcifications within the collecting system of either kidney, along the course of either ureter, or within the lumen of the urinary bladder. No hydroureteronephrosis or perinephric stranding to suggest urinary tract obstruction at this time. The unenhanced appearance of the kidneys is unremarkable bilaterally.  The unenhanced appearance the liver, gallbladder, pancreas, spleen bilateral adrenal glands is unremarkable. No significant volume of ascites no pneumoperitoneum. No pathologic distention of small bowel. No definite lymphadenopathy identified within the abdomen or pelvis on today's contrast CT examination. Normal appendix.  Musculoskeletal: There are no aggressive appearing lytic or blastic lesions noted in the visualized portions of the skeleton.  IMPRESSION: 1. No acute findings in the abdomen or pelvis to account for the patient's symptoms. 2. Specifically, no abnormal urinary tract calculi or findings urinary tract obstruction. 3. Normal appendix.   Electronically  Signed   By: Trudie Reed M.D.   On: 10/15/2013 07:49    EKG Interpretation   None       MDM   1. Hematuria    Pt with right flank pain, hematuria.  No vomiting, fevers.  Pt feeling better after toradol.  No evidence of stone on CT.  No infection.  Could be recently passed stone.  Will prescribe short course of abx, given referral to urology if symptoms continue.  Advised at some point will need to have his urine rechecked to make sure that the blood clears.  Will return if symptoms worsen.    Rolan Bucco, MD 10/15/13 [phone]

## 2013-10-15 NOTE — ED Notes (Signed)
Pt reports he noticed blood in his urine this morning. C/o lower abdominal pain and dysuria.

## 2013-12-21 ENCOUNTER — Encounter (HOSPITAL_BASED_OUTPATIENT_CLINIC_OR_DEPARTMENT_OTHER): Payer: Self-pay | Admitting: Emergency Medicine

## 2013-12-21 ENCOUNTER — Emergency Department (HOSPITAL_BASED_OUTPATIENT_CLINIC_OR_DEPARTMENT_OTHER)
Admission: EM | Admit: 2013-12-21 | Discharge: 2013-12-21 | Disposition: A | Discharge status: Home / Self Care | Payer: Self-pay | Attending: Emergency Medicine | Admitting: Emergency Medicine

## 2013-12-21 DIAGNOSIS — Z79899 Other long term (current) drug therapy: Secondary | ICD-10-CM | POA: Insufficient documentation

## 2013-12-21 DIAGNOSIS — E860 Dehydration: Secondary | ICD-10-CM | POA: Insufficient documentation

## 2013-12-21 DIAGNOSIS — J3489 Other specified disorders of nose and nasal sinuses: Secondary | ICD-10-CM | POA: Insufficient documentation

## 2013-12-21 DIAGNOSIS — K219 Gastro-esophageal reflux disease without esophagitis: Secondary | ICD-10-CM | POA: Insufficient documentation

## 2013-12-21 DIAGNOSIS — R197 Diarrhea, unspecified: Secondary | ICD-10-CM | POA: Insufficient documentation

## 2013-12-21 DIAGNOSIS — Z791 Long term (current) use of non-steroidal anti-inflammatories (NSAID): Secondary | ICD-10-CM | POA: Insufficient documentation

## 2013-12-21 LAB — URINALYSIS, ROUTINE W REFLEX MICROSCOPIC
Bilirubin Urine: NEGATIVE
Glucose, UA: NEGATIVE mg/dL
Hgb urine dipstick: NEGATIVE
KETONES UR: NEGATIVE mg/dL
LEUKOCYTES UA: NEGATIVE
NITRITE: NEGATIVE
PROTEIN: NEGATIVE mg/dL
Specific Gravity, Urine: 1.031 — ABNORMAL HIGH (ref 1.005–1.030)
Urobilinogen, UA: 0.2 mg/dL (ref 0.0–1.0)
pH: 6 (ref 5.0–8.0)

## 2013-12-21 LAB — BASIC METABOLIC PANEL
BUN: 14 mg/dL (ref 6–23)
CO2: 26 mEq/L (ref 19–32)
CREATININE: 0.8 mg/dL (ref 0.50–1.35)
Calcium: 9.6 mg/dL (ref 8.4–10.5)
Chloride: 104 mEq/L (ref 96–112)
Glucose, Bld: 111 mg/dL — ABNORMAL HIGH (ref 70–99)
Potassium: 4.5 mEq/L (ref 3.7–5.3)
Sodium: 141 mEq/L (ref 137–147)

## 2013-12-21 NOTE — Discharge Instructions (Signed)
Your urine today is normal. Your blood work shows that you are not dehydrated. Stay on the B.R.A.T diet for the the next 24 hours. Follow up with your doctor as scheduled. Return here as needed for worsening symptoms.

## 2013-12-21 NOTE — ED Notes (Signed)
Diarrhea and "head congestion"

## 2013-12-21 NOTE — ED Provider Notes (Signed)
CSN: [id number]     Arrival date & time 12/21/13  1002 History   First MD Initiated Contact with Patient 12/21/13 1201     Chief Complaint  Patient presents with  . Diarrhea   (Consider location/radiation/quality/duration/timing/severity/associated sxs/prior Treatment) Patient is a 33 y.o. male presenting with diarrhea. The history is provided by the patient.  Diarrhea Quality:  Watery Onset quality:  Sudden Number of episodes:  7 times yesterday, none today Duration:  2 days Progression:  Improving Relieved by:  None tried Worsened by:  Nothing tried Ineffective treatments:  None tried Associated symptoms: URI   Associated symptoms: no abdominal pain, no chills, no recent cough, no fever, no headaches and no vomiting   Risk factors: no recent antibiotic use, no sick contacts, no suspicious food intake and no travel to endemic areas    Gabriel Barnes is a 33 y.o. male who presents to the ED with diarrhea that started yesterday. He states that he went about 7 times yesterday but has not gone today. No vomiting, states he is very hungry. He has been eating what he usually does. States he has had some muscle cramps but that has been going on several months and he has an appointment with his PCP in a couple weeks.   Past Medical History  Diagnosis Date  . Acid reflux    Past Surgical History  Procedure Laterality Date  . Knee arthroplasty    . Ankle surgery    . Knee surgery     No family history on file. History  Substance Use Topics  . Smoking status: Never Smoker   . Smokeless tobacco: Never Used  . Alcohol Use: Yes     Comment: socially    Review of Systems  Constitutional: Negative for fever and chills.  HENT: Positive for congestion. Negative for ear pain, sore throat and trouble swallowing.   Eyes: Negative for discharge and itching.  Respiratory: Negative for cough and wheezing.   Gastrointestinal: Positive for diarrhea. Negative for nausea, vomiting and abdominal  pain.  Genitourinary: Negative for dysuria and frequency.  Musculoskeletal: Negative for back pain.  Skin: Negative for rash.  Neurological: Negative for syncope and headaches.  Psychiatric/Behavioral: Negative for confusion. The patient is not nervous/anxious.     Allergies  Review of patient's allergies indicates no known allergies.  Home Medications   Current Outpatient Rx  Name  Route  Sig  Dispense  Refill  . esomeprazole (NEXIUM) 40 MG capsule   Oral   Take 40 mg by mouth daily at 12 noon.         Marland Kitchen. HYDROcodone-acetaminophen (NORCO/VICODIN) 5-325 MG per tablet   Oral   Take 1-2 tablets by mouth every 4 (four) hours as needed.   15 tablet   0   . ibuprofen (ADVIL,MOTRIN) 600 MG tablet   Oral   Take 1 tablet (600 mg total) by mouth every 6 (six) hours as needed.   30 tablet   0   . naproxen (NAPROSYN) 500 MG tablet   Oral   Take 1 tablet (500 mg total) by mouth 2 (two) times daily.   30 tablet   0   . traMADol (ULTRAM) 50 MG tablet   Oral   Take 1 tablet (50 mg total) by mouth every 6 (six) hours as needed.   15 tablet   0    BP 118/69  Pulse 80  Temp(Src) 98 F (36.7 C) (Oral)  Resp 16  Ht 6\' 1"  (1.854 m)  Wt 263 lb (119.296 kg)  BMI 34.71 kg/m2  SpO2 98% Physical Exam  Nursing note and vitals reviewed. Constitutional: He is oriented to person, place, and time. He appears well-developed and well-nourished. No distress.  Patient appears well hydrated. Eating cheese bits in exam room.   HENT:  Head: Atraumatic.  Eyes: EOM are normal.  Neck: Neck supple.  Cardiovascular: Normal rate and regular rhythm.   Pulmonary/Chest: Effort normal and breath sounds normal.  Abdominal: Soft. Bowel sounds are normal. There is no tenderness.  Musculoskeletal: Normal range of motion.  Neurological: He is alert and oriented to person, place, and time. No cranial nerve deficit.  Skin: Skin is warm and dry.  Psychiatric: He has a normal mood and affect. His behavior  is normal.    ED Course  Procedures Results for orders placed during the hospital encounter of 12/21/13 (from the past 24 hour(s))  URINALYSIS, ROUTINE W REFLEX MICROSCOPIC     Status: Abnormal   Collection Time    12/21/13 11:48 AM      Result Value Range   Color, Urine YELLOW  YELLOW   APPearance CLEAR  CLEAR   Specific Gravity, Urine 1.031 (*) 1.005 - 1.030   pH 6.0  5.0 - 8.0   Glucose, UA NEGATIVE  NEGATIVE mg/dL   Hgb urine dipstick NEGATIVE  NEGATIVE   Bilirubin Urine NEGATIVE  NEGATIVE   Ketones, ur NEGATIVE  NEGATIVE mg/dL   Protein, ur NEGATIVE  NEGATIVE mg/dL   Urobilinogen, UA 0.2  0.0 - 1.0 mg/dL   Nitrite NEGATIVE  NEGATIVE   Leukocytes, UA NEGATIVE  NEGATIVE  BASIC METABOLIC PANEL     Status: Abnormal   Collection Time    12/21/13 12:38 PM      Result Value Range   Sodium 141  137 - 147 mEq/L   Potassium 4.5  3.7 - 5.3 mEq/L   Chloride 104  96 - 112 mEq/L   CO2 26  19 - 32 mEq/L   Glucose, Bld 111 (*) 70 - 99 mg/dL   BUN 14  6 - 23 mg/dL   Creatinine, Ser 1.61  0.50 - 1.35 mg/dL   Calcium 9.6  8.4 - 09.6 mg/dL   GFR calc non Af Amer >90  >90 mL/min   GFR calc Af Amer >90  >90 mL/min    MDM  33 y.o. male with history of diarrhea yesterday but none today. Discussed diet for diarrhea and he voices understanding. Patient stable for discharge with normal urine and BMET. He has a follow up appointment with his PCP that he will keep. He will return here as needed for any problems.      Janne Napoleon, [place] 12/21/13 1431

## 2013-12-21 NOTE — ED Provider Notes (Signed)
Medical screening examination/treatment/procedure(s) were performed by non-physician practitioner and as supervising physician I was immediately available for consultation/collaboration.  EKG Interpretation   None         Glynn OctaveStephen Wadsworth Skolnick, MD 12/21/13 [phone]

## 2014-01-12 ENCOUNTER — Emergency Department (HOSPITAL_BASED_OUTPATIENT_CLINIC_OR_DEPARTMENT_OTHER): Payer: Self-pay

## 2014-01-12 ENCOUNTER — Emergency Department (HOSPITAL_BASED_OUTPATIENT_CLINIC_OR_DEPARTMENT_OTHER)
Admission: EM | Admit: 2014-01-12 | Discharge: 2014-01-12 | Disposition: A | Discharge status: Home / Self Care | Payer: Self-pay | Attending: Emergency Medicine | Admitting: Emergency Medicine

## 2014-01-12 ENCOUNTER — Encounter (HOSPITAL_BASED_OUTPATIENT_CLINIC_OR_DEPARTMENT_OTHER): Payer: Self-pay | Admitting: Emergency Medicine

## 2014-01-12 DIAGNOSIS — R1013 Epigastric pain: Secondary | ICD-10-CM | POA: Insufficient documentation

## 2014-01-12 DIAGNOSIS — K0889 Other specified disorders of teeth and supporting structures: Secondary | ICD-10-CM

## 2014-01-12 DIAGNOSIS — K089 Disorder of teeth and supporting structures, unspecified: Secondary | ICD-10-CM | POA: Insufficient documentation

## 2014-01-12 DIAGNOSIS — Z791 Long term (current) use of non-steroidal anti-inflammatories (NSAID): Secondary | ICD-10-CM | POA: Insufficient documentation

## 2014-01-12 DIAGNOSIS — K219 Gastro-esophageal reflux disease without esophagitis: Secondary | ICD-10-CM | POA: Insufficient documentation

## 2014-01-12 DIAGNOSIS — Z79899 Other long term (current) drug therapy: Secondary | ICD-10-CM | POA: Insufficient documentation

## 2014-01-12 LAB — CBC WITH DIFFERENTIAL/PLATELET
BASOS PCT: 0 % (ref 0–1)
Basophils Absolute: 0 10*3/uL (ref 0.0–0.1)
EOS PCT: 0 % (ref 0–5)
Eosinophils Absolute: 0 10*3/uL (ref 0.0–0.7)
HEMATOCRIT: 43.9 % (ref 39.0–52.0)
Hemoglobin: 15.4 g/dL (ref 13.0–17.0)
Lymphocytes Relative: 38 % (ref 12–46)
Lymphs Abs: 3.1 10*3/uL (ref 0.7–4.0)
MCH: 31.2 pg (ref 26.0–34.0)
MCHC: 35.1 g/dL (ref 30.0–36.0)
MCV: 89 fL (ref 78.0–100.0)
MONO ABS: 0.8 10*3/uL (ref 0.1–1.0)
Monocytes Relative: 10 % (ref 3–12)
NEUTROS ABS: 4.3 10*3/uL (ref 1.7–7.7)
Neutrophils Relative %: 52 % (ref 43–77)
Platelets: 317 10*3/uL (ref 150–400)
RBC: 4.93 MIL/uL (ref 4.22–5.81)
RDW: 13.6 % (ref 11.5–15.5)
WBC: 8.3 10*3/uL (ref 4.0–10.5)

## 2014-01-12 LAB — URINALYSIS, ROUTINE W REFLEX MICROSCOPIC
Bilirubin Urine: NEGATIVE
GLUCOSE, UA: NEGATIVE mg/dL
Hgb urine dipstick: NEGATIVE
KETONES UR: 15 mg/dL — AB
Leukocytes, UA: NEGATIVE
Nitrite: NEGATIVE
Protein, ur: 30 mg/dL — AB
Specific Gravity, Urine: 1.042 — ABNORMAL HIGH (ref 1.005–1.030)
Urobilinogen, UA: 0.2 mg/dL (ref 0.0–1.0)
pH: 5.5 (ref 5.0–8.0)

## 2014-01-12 LAB — COMPREHENSIVE METABOLIC PANEL WITH GFR
ALT: 22 U/L (ref 0–53)
AST: 29 U/L (ref 0–37)
Albumin: 5 g/dL (ref 3.5–5.2)
Alkaline Phosphatase: 68 U/L (ref 39–117)
BUN: 19 mg/dL (ref 6–23)
CO2: 23 meq/L (ref 19–32)
Calcium: 9.8 mg/dL (ref 8.4–10.5)
Chloride: 99 meq/L (ref 96–112)
Creatinine, Ser: 1 mg/dL (ref 0.50–1.35)
GFR calc Af Amer: >90 mL/min
GFR calc non Af Amer: >90 mL/min
Glucose, Bld: 100 mg/dL — ABNORMAL HIGH (ref 70–99)
Potassium: 3.8 meq/L (ref 3.7–5.3)
Sodium: 140 meq/L (ref 137–147)
Total Bilirubin: 0.8 mg/dL (ref 0.3–1.2)
Total Protein: 8.2 g/dL (ref 6.0–8.3)

## 2014-01-12 LAB — URINE MICROSCOPIC-ADD ON

## 2014-01-12 LAB — LIPASE, BLOOD: Lipase: 41 U/L (ref 11–59)

## 2014-01-12 MED ORDER — GI COCKTAIL ~~LOC~~
30.0000 mL | Freq: Once | ORAL | Status: DC
  Filled 2014-01-12: qty 30

## 2014-01-12 MED ORDER — HYDROCODONE-ACETAMINOPHEN 5-325 MG PO TABS: 2.0000 | ORAL_TABLET | ORAL | Status: DC | PRN

## 2014-01-12 MED ORDER — OMEPRAZOLE 20 MG PO CPDR: 20.0000 mg | DELAYED_RELEASE_CAPSULE | Freq: Every day | ORAL | Status: DC

## 2014-01-12 MED ORDER — HYDROCODONE-ACETAMINOPHEN 5-325 MG PO TABS
1.0000 | ORAL_TABLET | Freq: Once | ORAL | Status: AC
  Administered 2014-01-12: 1 via ORAL
  Filled 2014-01-12: qty 1

## 2014-01-12 NOTE — ED Notes (Signed)
Pt refused Tylenol , waiting on ride

## 2014-01-12 NOTE — Discharge Instructions (Signed)
Gastroesophageal Reflux Disease, Adult Follow up with your doctor and dentist. Take the stomach medication as prescribed. Return to the ED if you develop new or worsening symptoms. Gastroesophageal reflux disease (GERD) happens when acid from your stomach flows up into the esophagus. When acid comes in contact with the esophagus, the acid causes soreness (inflammation) in the esophagus. Over time, GERD may create small holes (ulcers) in the lining of the esophagus. CAUSES   Increased body weight. This puts pressure on the stomach, making acid rise from the stomach into the esophagus.  Smoking. This increases acid production in the stomach.  Drinking alcohol. This causes decreased pressure in the lower esophageal sphincter (valve or ring of muscle between the esophagus and stomach), allowing acid from the stomach into the esophagus.  Late evening meals and a full stomach. This increases pressure and acid production in the stomach.  A malformed lower esophageal sphincter. Sometimes, no cause is found. SYMPTOMS   Burning pain in the lower part of the mid-chest behind the breastbone and in the mid-stomach area. This may occur twice a week or more often.  Trouble swallowing.  Sore throat.  Dry cough.  Asthma-like symptoms including chest tightness, shortness of breath, or wheezing. DIAGNOSIS  Your caregiver may be able to diagnose GERD based on your symptoms. In some cases, X-rays and other tests may be done to check for complications or to check the condition of your stomach and esophagus. TREATMENT  Your caregiver may recommend over-the-counter or prescription medicines to help decrease acid production. Ask your caregiver before starting or adding any new medicines.  HOME CARE INSTRUCTIONS   Change the factors that you can control. Ask your caregiver for guidance concerning weight loss, quitting smoking, and alcohol consumption.  Avoid foods and drinks that make your symptoms worse,  such as:  Caffeine or alcoholic drinks.  Chocolate.  Peppermint or mint flavorings.  Garlic and onions.  Spicy foods.  Citrus fruits, such as oranges, lemons, or limes.  Tomato-based foods such as sauce, chili, salsa, and pizza.  Fried and fatty foods.  Avoid lying down for the 3 hours prior to your bedtime or prior to taking a nap.  Eat small, frequent meals instead of large meals.  Wear loose-fitting clothing. Do not wear anything tight around your waist that causes pressure on your stomach.  Raise the head of your bed 6 to 8 inches with wood blocks to help you sleep. Extra pillows will not help.  Only take over-the-counter or prescription medicines for pain, discomfort, or fever as directed by your caregiver.  Do not take aspirin, ibuprofen, or other nonsteroidal anti-inflammatory drugs (NSAIDs). SEEK IMMEDIATE MEDICAL CARE IF:   You have pain in your arms, neck, jaw, teeth, or back.  Your pain increases or changes in intensity or duration.  You develop nausea, vomiting, or sweating (diaphoresis).  You develop shortness of breath, or you faint.  Your vomit is green, yellow, black, or looks like coffee grounds or blood.  Your stool is red, bloody, or black. These symptoms could be signs of other problems, such as heart disease, gastric bleeding, or esophageal bleeding. MAKE SURE YOU:   Understand these instructions.  Will watch your condition.  Will get help right away if you are not doing well or get worse. Document Released: 08/19/2005 Document Revised: 02/01/2012 Document Reviewed: 05/29/2011 [facility] [employer] Patient Information 2014 [employer], [place].

## 2014-01-12 NOTE — ED Notes (Signed)
Pt cursing at staff , pt requesting pain med for tooth , refused gi cocktail

## 2014-01-12 NOTE — ED Notes (Signed)
Pt linstructed not to drive d/t narcotics , security informed pt drove self away

## 2014-01-12 NOTE — ED Provider Notes (Signed)
CSN: [id number]     Arrival date & time 01/12/14  1731 History  This chart was scribed for Glynn Octave, MD by Valera Castle, ED Scribe. This patient was seen in room MH04/MH04 and the patient's care was started at 5:57 PM.   Chief Complaint  Patient presents with  . Dental Pain  . Abdominal Pain   (Consider location/radiation/quality/duration/timing/severity/associated sxs/prior Treatment) The history is provided by the patient. No language interpreter was used.   HPI Comments: Gabriel Barnes is a 33 y.o. male who presents to the Emergency Department complaining of constant, left, upper dental pain, onset 2 days ago. He reports h/o decayed tooth. He reports pain when swallowing, but denies trouble swallowing. He denies taking any pain medication.  He also reports intermittent, sharp, epigastric, abdominal pain, with associated nausea, onset 2 weeks ago. He reports eating exacerbates his abdominal pain. He states he was seen at [facility], had blood work done, but denies any diagnosis, stating he left early. He reports his father's side has h/o acid reflex. He reports intermittent bowel movements. He denies having CT, US abdomen done. He denies taking any prescribed medication for his abdominal pain. He reports h/o smoking and EtOH use. He states when he does drink he drinks a lot, but doesn't drink everyday. He denies h/o DM, daily medications.   He denies fever, vomiting, chest pain, SOB, back pain, testicle pain, and any other associated symptoms.   PCP - No PCP Per Patient  Past Medical History  Diagnosis Date  . Acid reflux    Past Surgical History  Procedure Laterality Date  . Knee arthroplasty    . Ankle surgery    . Knee surgery     History reviewed. No pertinent family history. History  Substance Use Topics  . Smoking status: Never Smoker   . Smokeless tobacco: Never Used  . Alcohol Use: Yes     Comment: socially    Review of Systems A complete 10 system review of  systems was obtained and all systems are negative except as noted in the HPI and PMH.   Allergies  Review of patient's allergies indicates no known allergies.  Home Medications   Current Outpatient Rx  Name  Route  Sig  Dispense  Refill  . aluminum & magnesium hydroxide-simethicone (MYLANTA) 500-450-40 MG/5ML suspension   Oral   Take by mouth every 6 (six) hours as needed for indigestion.         Marland Kitchen esomeprazole (NEXIUM) 40 MG capsule   Oral   Take 40 mg by mouth daily at 12 noon.         Marland Kitchen HYDROcodone-acetaminophen (NORCO/VICODIN) 5-325 MG per tablet   Oral   Take 1-2 tablets by mouth every 4 (four) hours as needed.   15 tablet   0   . HYDROcodone-acetaminophen (NORCO/VICODIN) 5-325 MG per tablet   Oral   Take 2 tablets by mouth every 4 (four) hours as needed.   10 tablet   0   . ibuprofen (ADVIL,MOTRIN) 600 MG tablet   Oral   Take 1 tablet (600 mg total) by mouth every 6 (six) hours as needed.   30 tablet   0   . naproxen (NAPROSYN) 500 MG tablet   Oral   Take 1 tablet (500 mg total) by mouth 2 (two) times daily.   30 tablet   0   . omeprazole (PRILOSEC) 20 MG capsule   Oral   Take 1 capsule (20 mg total) by mouth daily.  30 capsule   0   . traMADol (ULTRAM) 50 MG tablet   Oral   Take 1 tablet (50 mg total) by mouth every 6 (six) hours as needed.   15 tablet   0    BP 157/89  Pulse 100  Temp(Src) 98.4 F (36.9 C) (Oral)  Resp 20  Ht 6\' 1"  (1.854 m)  Wt 268 lb (121.564 kg)  BMI 35.37 kg/m2  SpO2 96%  Physical Exam  Nursing note and vitals reviewed. Constitutional: He is oriented to person, place, and time. He appears well-developed and well-nourished. No distress.  HENT:  Head: Normocephalic and atraumatic.  Mouth/Throat: Oropharynx is clear and moist. No oropharyngeal exudate.  Left upper incisor broken off at gumline. No abscess. Rest of mouth is soft.   Eyes: EOM are normal. Pupils are equal, round, and reactive to light.  Neck: Neck  supple.  Cardiovascular: Normal rate, regular rhythm and normal heart sounds.  Exam reveals no gallop and no friction rub.   No murmur heard. Pulmonary/Chest: Effort normal and breath sounds normal. No respiratory distress. He has no wheezes. He has no rales.  Abdominal: Soft. He exhibits no distension. There is tenderness (epigastric tenderness, no RUQ tenderness). There is no guarding, no CVA tenderness and negative Murphy's sign.  Musculoskeletal: Normal range of motion. He exhibits no edema and no tenderness.  Neurological: He is alert and oriented to person, place, and time. No cranial nerve deficit. He exhibits normal muscle tone. Coordination normal.  Skin: Skin is warm and dry.  Psychiatric: He has a normal mood and affect. His behavior is normal.    ED Course  Procedures (including critical care time)  DIAGNOSTIC STUDIES: Oxygen Saturation is 96% on room air, normal by my interpretation.    COORDINATION OF CARE: 6:02 PM-Discussed treatment plan which includes GI cocktail, DG abdomen, blood work, and UA with pt at bedside and pt agreed to plan.   Results for orders placed during the hospital encounter of 01/12/14  URINALYSIS, ROUTINE W REFLEX MICROSCOPIC      Result Value Ref Range   Color, Urine AMBER (*) YELLOW   APPearance CLEAR  CLEAR   Specific Gravity, Urine 1.042 (*) 1.005 - 1.030   pH 5.5  5.0 - 8.0   Glucose, UA NEGATIVE  NEGATIVE mg/dL   Hgb urine dipstick NEGATIVE  NEGATIVE   Bilirubin Urine NEGATIVE  NEGATIVE   Ketones, ur 15 (*) NEGATIVE mg/dL   Protein, ur 30 (*) NEGATIVE mg/dL   Urobilinogen, UA 0.2  0.0 - 1.0 mg/dL   Nitrite NEGATIVE  NEGATIVE   Leukocytes, UA NEGATIVE  NEGATIVE  CBC WITH DIFFERENTIAL      Result Value Ref Range   WBC 8.3  4.0 - 10.5 K/uL   RBC 4.93  4.22 - 5.81 MIL/uL   Hemoglobin 15.4  13.0 - 17.0 g/dL   HCT 16.1  09.6 - 04.5 %   MCV 89.0  78.0 - 100.0 fL   MCH 31.2  26.0 - 34.0 pg   MCHC 35.1  30.0 - 36.0 g/dL   RDW 40.9  81.1 -  91.4 %   Platelets 317  150 - 400 K/uL   Neutrophils Relative % 52  43 - 77 %   Neutro Abs 4.3  1.7 - 7.7 K/uL   Lymphocytes Relative 38  12 - 46 %   Lymphs Abs 3.1  0.7 - 4.0 K/uL   Monocytes Relative 10  3 - 12 %   Monocytes Absolute  0.8  0.1 - 1.0 K/uL   Eosinophils Relative 0  0 - 5 %   Eosinophils Absolute 0.0  0.0 - 0.7 K/uL   Basophils Relative 0  0 - 1 %   Basophils Absolute 0.0  0.0 - 0.1 K/uL  COMPREHENSIVE METABOLIC PANEL      Result Value Ref Range   Sodium 140  137 - 147 mEq/L   Potassium 3.8  3.7 - 5.3 mEq/L   Chloride 99  96 - 112 mEq/L   CO2 23  19 - 32 mEq/L   Glucose, Bld 100 (*) 70 - 99 mg/dL   BUN 19  6 - 23 mg/dL   Creatinine, Ser 0.451.00  0.50 - 1.35 mg/dL   Calcium 9.8  8.4 - 40.910.5 mg/dL   Total Protein 8.2  6.0 - 8.3 g/dL   Albumin 5.0  3.5 - 5.2 g/dL   AST 29  0 - 37 U/L   ALT 22  0 - 53 U/L   Alkaline Phosphatase 68  39 - 117 U/L   Total Bilirubin 0.8  0.3 - 1.2 mg/dL   GFR calc non Af Amer >90  >90 mL/min   GFR calc Af Amer >90  >90 mL/min  LIPASE, BLOOD      Result Value Ref Range   Lipase 41  11 - 59 U/L  URINE MICROSCOPIC-ADD ON      Result Value Ref Range   Squamous Epithelial / LPF RARE  RARE   WBC, UA 3-6  <3 WBC/hpf   RBC / HPF 0-2  <3 RBC/hpf   Bacteria, UA MANY (*) RARE   Urine-Other MUCOUS PRESENT     Dg Abd Acute W/chest  01/12/2014   CLINICAL DATA:  Epigastric abdominal pain  EXAM: ACUTE ABDOMEN SERIES (ABDOMEN 2 VIEW & CHEST 1 VIEW)  COMPARISON:  10/15/2013 CT without contrast  FINDINGS: There is no evidence of dilated bowel loops or free intraperitoneal air. No radiopaque calculi or other significant radiographic abnormality is seen. Heart size and mediastinal contours are within normal limits. Both lungs are clear.  IMPRESSION: Negative abdominal radiographs.  No acute cardiopulmonary disease.   Electronically Signed   By: Ruel Favorsrevor  Shick M.D.   On: 01/12/2014 18:59    Medications  HYDROcodone-acetaminophen (NORCO/VICODIN) 5-325 MG per  tablet 1 tablet (1 tablet Oral Given 01/12/14 1849)    MDM   Final diagnoses:  Pain, dental  Epigastric pain    2 day history of left upper dental pain and said a broken tooth. No difficulty breathing or swallowing. Ongoing epigastric abdominal pain for the past several weeks associated with nausea.  LFTs normal. Lipase normal. Abdomen soft and nontender without peritoneal signs. Suspect patient's abdominal pain secondary to gastritis, esophagitis, peptic ulcer disease or reflux. He has a family history of ulcers. He admits he is use ibuprofen extensively for dental pain the past but not recently. He is instructed to refrain from NSAIDs, alcohol, caffeine and smoking. He needs to establish care with PCP.  He'll be given a short course of pain medication for his tooth problem. Will start PPI.  I personally performed the services described in this documentation, which was scribed in my presence. The recorded information has been reviewed and is accurate.   Glynn OctaveStephen Maki Hege, MD 01/12/14 1910

## 2014-01-12 NOTE — ED Notes (Signed)
MD at bedside. 

## 2014-01-12 NOTE — ED Notes (Signed)
Pt states that he has L upper jaw dental pain, broken tooth.  Pt also c/o abdominal pain in the mid abdomen.  States he was worked up for this at this facility but left before results came back because he had something else to do.  Denies nausea or vomiting, afebrile.

## 2014-01-12 NOTE — ED Notes (Signed)
[facility] called

## 2014-04-14 ENCOUNTER — Emergency Department (HOSPITAL_BASED_OUTPATIENT_CLINIC_OR_DEPARTMENT_OTHER)
Admission: EM | Admit: 2014-04-14 | Discharge: 2014-04-14 | Disposition: A | Discharge status: Home / Self Care | Payer: Self-pay | Attending: Emergency Medicine | Admitting: Emergency Medicine

## 2014-04-14 ENCOUNTER — Encounter (HOSPITAL_BASED_OUTPATIENT_CLINIC_OR_DEPARTMENT_OTHER): Payer: Self-pay | Admitting: Emergency Medicine

## 2014-04-14 DIAGNOSIS — K219 Gastro-esophageal reflux disease without esophagitis: Secondary | ICD-10-CM | POA: Insufficient documentation

## 2014-04-14 DIAGNOSIS — Z79899 Other long term (current) drug therapy: Secondary | ICD-10-CM | POA: Insufficient documentation

## 2014-04-14 MED ORDER — GI COCKTAIL ~~LOC~~
30.0000 mL | Freq: Once | ORAL | Status: AC
  Administered 2014-04-14: 30 mL via ORAL
  Filled 2014-04-14: qty 30

## 2014-04-14 MED ORDER — SUCRALFATE 1 GM/10ML PO SUSP: 1.0000 g | Freq: Three times a day (TID) | ORAL | Status: DC

## 2014-04-14 MED ORDER — OMEPRAZOLE 20 MG PO CPDR: 20.0000 mg | DELAYED_RELEASE_CAPSULE | Freq: Every day | ORAL | Status: DC

## 2014-04-14 NOTE — Discharge Instructions (Signed)

## 2014-04-14 NOTE — ED Provider Notes (Signed)
CSN: [id number]     Arrival date & time 04/14/14  0444 History   First MD Initiated Contact with Patient 04/14/14 [phone]     Chief Complaint  Patient presents with  . Abdominal Pain     (Consider location/radiation/quality/duration/timing/severity/associated sxs/prior Treatment) Patient is a 33 y.o. male presenting with abdominal pain. The history is provided by the patient.  Abdominal Pain Pain location:  Epigastric Pain quality: burning   Pain radiates to:  Does not radiate Pain severity:  Moderate Onset quality:  Gradual Duration:  2 days Timing:  Constant Progression:  Unchanged Chronicity:  Recurrent Context: eating   Relieved by:  Nothing Worsened by:  Nothing tried Ineffective treatments:  None tried Associated symptoms: no anorexia, no chest pain, no fever, no nausea, no shortness of breath and no vomiting   Risk factors: not elderly     Past Medical History  Diagnosis Date  . Acid reflux    Past Surgical History  Procedure Laterality Date  . Knee arthroplasty    . Ankle surgery    . Knee surgery     No family history on file. History  Substance Use Topics  . Smoking status: Never Smoker   . Smokeless tobacco: Never Used  . Alcohol Use: Yes     Comment: socially    Review of Systems  Constitutional: Negative for fever.  Respiratory: Negative for shortness of breath.   Cardiovascular: Negative for chest pain, palpitations and leg swelling.  Gastrointestinal: Positive for abdominal pain. Negative for nausea, vomiting and anorexia.  All other systems reviewed and are negative.     Allergies  Review of patient's allergies indicates no known allergies.  Home Medications   Prior to Admission medications   Medication Sig Start Date End Date Taking? Authorizing Provider  aluminum & magnesium hydroxide-simethicone (MYLANTA) 500-450-40 MG/5ML suspension Take by mouth every 6 (six) hours as needed for indigestion.    Historical Provider, MD  esomeprazole  (NEXIUM) 40 MG capsule Take 40 mg by mouth daily at 12 noon.    Historical Provider, MD  HYDROcodone-acetaminophen (NORCO/VICODIN) 5-325 MG per tablet Take 1-2 tablets by mouth every 4 (four) hours as needed. 10/02/13   Teressa LowerVrinda Pickering, NP  HYDROcodone-acetaminophen (NORCO/VICODIN) 5-325 MG per tablet Take 2 tablets by mouth every 4 (four) hours as needed. 01/12/14   Glynn OctaveStephen Rancour, MD  ibuprofen (ADVIL,MOTRIN) 600 MG tablet Take 1 tablet (600 mg total) by mouth every 6 (six) hours as needed. 10/15/13   Rolan BuccoMelanie Belfi, MD  naproxen (NAPROSYN) 500 MG tablet Take 1 tablet (500 mg total) by mouth 2 (two) times daily. 08/19/13   Phill MutterPeter S Dammen, PA-C  omeprazole (PRILOSEC) 20 MG capsule Take 1 capsule (20 mg total) by mouth daily. 01/12/14   Glynn OctaveStephen Rancour, MD  traMADol (ULTRAM) 50 MG tablet Take 1 tablet (50 mg total) by mouth every 6 (six) hours as needed. 10/15/13   Rolan BuccoMelanie Belfi, MD   BP 127/81  Pulse 79  Temp(Src) 97.8 F (36.6 C) (Oral)  Resp 18  SpO2 100% Physical Exam  Constitutional: He is oriented to person, place, and time. He appears well-developed and well-nourished. No distress.  Sleeping upon entrance to the room, easily aroused  HENT:  Head: Normocephalic and atraumatic.  Mouth/Throat: Oropharynx is clear and moist.  Eyes: Conjunctivae are normal. Pupils are equal, round, and reactive to light.  Neck: Normal range of motion. Neck supple.  Cardiovascular: Normal rate, regular rhythm and intact distal pulses.   Pulmonary/Chest: Effort normal and breath  sounds normal. He has no wheezes. He has no rales.  Abdominal: Soft. He exhibits no distension and no mass. Bowel sounds are increased. There is no tenderness. There is no rigidity, no rebound, no guarding, no tenderness at McBurney's point and negative Murphy's sign.  Musculoskeletal: Normal range of motion.  Neurological: He is alert and oriented to person, place, and time.  Skin: Skin is warm and dry.  Psychiatric: He has a  normal mood and affect.    ED Course  Procedures (including critical care time) Labs Review Labs Reviewed - No data to display  Imaging Review No results found.   EKG Interpretation None      MDM   Final diagnoses:  None    Patient has GERD and has not been taking his medication.  Symptoms are similar to previous visit.  Exam and vitals benign and reassuring there is no indication for labs or imaging.  Will restart PPI and carafate and give Diet for GERD    Gabriel Barnes K Calab Sachse-Rasch, MD 04/14/14 0500

## 2014-04-14 NOTE — ED Notes (Signed)
Epigastric pain x 2 days, "but this problem has been going on for a while now". Hx of gastric reflux. Pain 10/10 throbbing.

## 2015-03-08 ENCOUNTER — Encounter (HOSPITAL_COMMUNITY): Payer: Self-pay | Admitting: *Deleted

## 2015-03-08 ENCOUNTER — Emergency Department (HOSPITAL_COMMUNITY)
Admission: EM | Admit: 2015-03-08 | Discharge: 2015-03-08 | Disposition: A | Discharge status: Other Acute Inpatient Hospital | Payer: Self-pay | Attending: Emergency Medicine | Admitting: Emergency Medicine

## 2015-03-08 ENCOUNTER — Inpatient Hospital Stay (HOSPITAL_COMMUNITY)
Admission: AD | Admit: 2015-03-08 | Discharge: 2015-03-14 | DRG: 885 | Disposition: A | Discharge status: Home / Self Care | Payer: Federal, State, Local not specified - Other | Source: Intra-hospital | Attending: Psychiatry | Admitting: Psychiatry

## 2015-03-08 ENCOUNTER — Encounter (HOSPITAL_COMMUNITY): Payer: Self-pay

## 2015-03-08 DIAGNOSIS — G4700 Insomnia, unspecified: Secondary | ICD-10-CM | POA: Diagnosis present

## 2015-03-08 DIAGNOSIS — F39 Unspecified mood [affective] disorder: Secondary | ICD-10-CM | POA: Diagnosis not present

## 2015-03-08 DIAGNOSIS — F1420 Cocaine dependence, uncomplicated: Secondary | ICD-10-CM | POA: Diagnosis present

## 2015-03-08 DIAGNOSIS — Z791 Long term (current) use of non-steroidal anti-inflammatories (NSAID): Secondary | ICD-10-CM | POA: Insufficient documentation

## 2015-03-08 DIAGNOSIS — F1910 Other psychoactive substance abuse, uncomplicated: Secondary | ICD-10-CM

## 2015-03-08 DIAGNOSIS — F1210 Cannabis abuse, uncomplicated: Secondary | ICD-10-CM | POA: Diagnosis present

## 2015-03-08 DIAGNOSIS — F3162 Bipolar disorder, current episode mixed, moderate: Principal | ICD-10-CM | POA: Insufficient documentation

## 2015-03-08 DIAGNOSIS — F419 Anxiety disorder, unspecified: Secondary | ICD-10-CM | POA: Diagnosis present

## 2015-03-08 DIAGNOSIS — F1020 Alcohol dependence, uncomplicated: Secondary | ICD-10-CM | POA: Diagnosis present

## 2015-03-08 DIAGNOSIS — F32A Depression, unspecified: Secondary | ICD-10-CM | POA: Diagnosis present

## 2015-03-08 DIAGNOSIS — F329 Major depressive disorder, single episode, unspecified: Secondary | ICD-10-CM

## 2015-03-08 DIAGNOSIS — Z79899 Other long term (current) drug therapy: Secondary | ICD-10-CM | POA: Insufficient documentation

## 2015-03-08 DIAGNOSIS — R45851 Suicidal ideations: Secondary | ICD-10-CM | POA: Diagnosis present

## 2015-03-08 DIAGNOSIS — K219 Gastro-esophageal reflux disease without esophagitis: Secondary | ICD-10-CM | POA: Insufficient documentation

## 2015-03-08 DIAGNOSIS — G4710 Hypersomnia, unspecified: Secondary | ICD-10-CM | POA: Diagnosis present

## 2015-03-08 HISTORY — DX: Major depressive disorder, single episode, unspecified: F32.9

## 2015-03-08 HISTORY — DX: Other psychoactive substance abuse, uncomplicated: F19.10

## 2015-03-08 HISTORY — DX: Depression, unspecified: F32.A

## 2015-03-08 HISTORY — DX: Bipolar disorder, unspecified: F31.9

## 2015-03-08 HISTORY — DX: Anxiety disorder, unspecified: F41.9

## 2015-03-08 LAB — I-STAT CHEM 8, ED
BUN: 17 mg/dL (ref 6–23)
CALCIUM ION: 1.14 mmol/L (ref 1.12–1.23)
CHLORIDE: 104 mmol/L (ref 96–112)
CREATININE: 1 mg/dL (ref 0.50–1.35)
GLUCOSE: 101 mg/dL — AB (ref 70–99)
HCT: 52 % (ref 39.0–52.0)
Hemoglobin: 17.7 g/dL — ABNORMAL HIGH (ref 13.0–17.0)
Potassium: 4.2 mmol/L (ref 3.5–5.1)
Sodium: 138 mmol/L (ref 135–145)
TCO2: 20 mmol/L (ref 0–100)

## 2015-03-08 LAB — CBC WITH DIFFERENTIAL/PLATELET
Basophils Absolute: 0 10*3/uL (ref 0.0–0.1)
Basophils Relative: 0 % (ref 0–1)
EOS ABS: 0.1 10*3/uL (ref 0.0–0.7)
EOS PCT: 1 % (ref 0–5)
HEMATOCRIT: 46.8 % (ref 39.0–52.0)
Hemoglobin: 16.2 g/dL (ref 13.0–17.0)
Lymphocytes Relative: 40 % (ref 12–46)
Lymphs Abs: 2.7 10*3/uL (ref 0.7–4.0)
MCH: 30.7 pg (ref 26.0–34.0)
MCHC: 34.6 g/dL (ref 30.0–36.0)
MCV: 88.6 fL (ref 78.0–100.0)
Monocytes Absolute: 0.6 10*3/uL (ref 0.1–1.0)
Monocytes Relative: 8 % (ref 3–12)
Neutro Abs: 3.4 10*3/uL (ref 1.7–7.7)
Neutrophils Relative %: 51 % (ref 43–77)
Platelets: 305 10*3/uL (ref 150–400)
RBC: 5.28 MIL/uL (ref 4.22–5.81)
RDW: 13.6 % (ref 11.5–15.5)
WBC: 6.8 10*3/uL (ref 4.0–10.5)

## 2015-03-08 LAB — RAPID URINE DRUG SCREEN, HOSP PERFORMED
AMPHETAMINES: NOT DETECTED
Barbiturates: NOT DETECTED
Benzodiazepines: NOT DETECTED
COCAINE: POSITIVE — AB
OPIATES: NOT DETECTED
Tetrahydrocannabinol: POSITIVE — AB

## 2015-03-08 LAB — ETHANOL: Alcohol, Ethyl (B): <5 mg/dL (ref 0–9)

## 2015-03-08 MED ORDER — TRAZODONE HCL 50 MG PO TABS
50.0000 mg | ORAL_TABLET | Freq: Every evening | ORAL | Status: DC | PRN
  Administered 2015-03-08 – 2015-03-13 (×5): 50 mg via ORAL
  Filled 2015-03-08: qty 14
  Filled 2015-03-08 (×4): qty 1

## 2015-03-08 MED ORDER — LORAZEPAM 1 MG PO TABS
0.0000 mg | ORAL_TABLET | Freq: Two times a day (BID) | ORAL | Status: AC
  Administered 2015-03-10 – 2015-03-11 (×3): 1 mg via ORAL
  Filled 2015-03-08 (×2): qty 1

## 2015-03-08 MED ORDER — NICOTINE 21 MG/24HR TD PT24: 21.0000 mg | MEDICATED_PATCH | Freq: Every day | TRANSDERMAL | Status: DC | PRN

## 2015-03-08 MED ORDER — ONDANSETRON HCL 4 MG PO TABS: 4.0000 mg | ORAL_TABLET | Freq: Three times a day (TID) | ORAL | Status: DC | PRN

## 2015-03-08 MED ORDER — ACETAMINOPHEN 325 MG PO TABS: 650.0000 mg | ORAL_TABLET | ORAL | Status: DC | PRN

## 2015-03-08 MED ORDER — THIAMINE HCL 100 MG/ML IJ SOLN: 100.0000 mg | Freq: Every day | INTRAMUSCULAR | Status: DC

## 2015-03-08 MED ORDER — IBUPROFEN 400 MG PO TABS
400.0000 mg | ORAL_TABLET | Freq: Three times a day (TID) | ORAL | Status: DC | PRN
  Administered 2015-03-12: 400 mg via ORAL
  Filled 2015-03-08: qty 1

## 2015-03-08 MED ORDER — LORAZEPAM 1 MG PO TABS
0.0000 mg | ORAL_TABLET | Freq: Four times a day (QID) | ORAL | Status: AC
  Filled 2015-03-08: qty 2

## 2015-03-08 MED ORDER — IBUPROFEN 400 MG PO TABS: 400.0000 mg | ORAL_TABLET | Freq: Three times a day (TID) | ORAL | Status: DC | PRN

## 2015-03-08 MED ORDER — ALUM & MAG HYDROXIDE-SIMETH 200-200-20 MG/5ML PO SUSP: 30.0000 mL | ORAL | Status: DC | PRN

## 2015-03-08 MED ORDER — VITAMIN B-1 100 MG PO TABS
100.0000 mg | ORAL_TABLET | Freq: Every day | ORAL | Status: DC
  Administered 2015-03-09 – 2015-03-14 (×5): 100 mg via ORAL
  Filled 2015-03-08 (×8): qty 1

## 2015-03-08 MED ORDER — LORAZEPAM 1 MG PO TABS
1.0000 mg | ORAL_TABLET | Freq: Three times a day (TID) | ORAL | Status: DC | PRN
  Administered 2015-03-08 – 2015-03-13 (×2): 1 mg via ORAL
  Filled 2015-03-08 (×2): qty 1

## 2015-03-08 MED ORDER — VITAMIN B-1 100 MG PO TABS
100.0000 mg | ORAL_TABLET | Freq: Every day | ORAL | Status: DC
  Administered 2015-03-08: 100 mg via ORAL
  Filled 2015-03-08: qty 1

## 2015-03-08 MED ORDER — MAGNESIUM HYDROXIDE 400 MG/5ML PO SUSP: 30.0000 mL | Freq: Every day | ORAL | Status: DC | PRN

## 2015-03-08 MED ORDER — LORAZEPAM 1 MG PO TABS: 0.0000 mg | ORAL_TABLET | Freq: Two times a day (BID) | ORAL | Status: DC

## 2015-03-08 MED ORDER — LORAZEPAM 1 MG PO TABS: 0.0000 mg | ORAL_TABLET | Freq: Four times a day (QID) | ORAL | Status: DC

## 2015-03-08 MED ORDER — LORAZEPAM 1 MG PO TABS: 1.0000 mg | ORAL_TABLET | Freq: Three times a day (TID) | ORAL | Status: DC | PRN

## 2015-03-08 NOTE — ED Notes (Signed)
Belonging logged, valuables locked up in safe.

## 2015-03-08 NOTE — BH Assessment (Addendum)
Tele Assessment Note   Gabriel Barnes is an 34 y.o. 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  He says he is depressed, only sleeps about 4 hrs per night, has panic attacks multiple times daily, and feels like everyone is out to get him. He is drinking alcohol and using marijuana and Mollys daily, then binging on liquor and cocaine on weekends, "I am a weekend warrior" (See SA section for details). Pt lives with his wife and four kids, and he describes his wife as supportive. He has no other family supports and says his parents and sister are substance abusers.  He has a history of being clean for a year while he was in [facility], but relapsed about a year ago. He has gone to OP treatment at [facility], but says he has not been taking his medication correctly.    During interview, pt was polite and cooperative, and his affect was tearful and mood depressed.  He says he feels hopeless and worthless and wants to get himself together. He denies any AVH, but does feel paranoid at times that people are out to get him.  There is no evidence of responding to internal stimuli. He was oriented to everything but date, his movement was restless, he made good eye contact, and had normal speech and thought content.  Renata Caprice, NP recommnends Ip treatment.  If [facility] has no beds, TTS will seek outside placement.  Axis I: Anxiety Disorder NOS, Bipolar, Depressed and Substance Abuse Axis II: Deferred Axis III:  Past Medical History  Diagnosis Date  . Acid reflux   . Depression   . Anxiety   . Bipolar 1 disorder    Axis IV: other psychosocial or environmental problems Axis V: 31-40 impairment in reality testing  Past Medical History:  Past Medical History  Diagnosis Date  . Acid reflux   . Depression   . Anxiety   . Bipolar 1 disorder     Past Surgical History  Procedure Laterality  Date  . Knee arthroplasty    . Ankle surgery    . Knee surgery      Family History: No family history on file.  Social History:  reports that he has never smoked. He has never used smokeless tobacco. He reports that he drinks alcohol. He reports that he uses illicit drugs (Marijuana and Cocaine) about once per week.  Additional Social History:  Alcohol / Drug Use Pain Medications: denies Prescriptions: denies Over the Counter: denies History of alcohol / drug use?: Yes Longest period of sobriety (when/how long): 1 year-- 2 years ago Negative Consequences of Use: Personal relationships Withdrawal Symptoms: Cramps, Diarrhea Substance #1 Name of Substance 1: alcohol 1 - Age of First Use: 13 1 - Amount (size/oz): 2- 24 oz cans of beer during the week, on the weekends he binges on liquor 1 - Frequency: daily, more on weekends 1 - Duration: 1 year 1 - Last Use / Amount: 5 am yesterday am Substance #2 Name of Substance 2: marijuana 2 - Age of First Use: 12 2 - Amount (size/oz): 2 blunts 2 - Frequency: daily 2 - Duration: 1/2 year 2 - Last Use / Amount: yesterday Substance #3 Name of Substance 3: Mollys 3 - Age of First Use: 3 years ago 3 - Amount (size/oz): 1 gram  3 - Frequency: eevery weekend 3 - Duration:  6 months Substance #4 Name of Substance 4: cocaine 4 - Age of First Use: 28 4 - Amount (size/oz): 1-2 grams 4 - Frequency: every weekend 4 - Duration: 6 months 4 - Last Use / Amount: last night  CIWA: CIWA-Ar BP: 141/79 mmHg Pulse Rate: 71 Nausea and Vomiting: 2 Tactile Disturbances: none Tremor: no tremor Auditory Disturbances: not present Paroxysmal Sweats: no sweat visible Visual Disturbances: not present Anxiety: mildly anxious Headache, Fullness in Head: severe Agitation: somewhat more than normal activity Orientation and Clouding of Sensorium: cannot do serial additions or is uncertain about date CIWA-Ar Total: 10 COWS:    PATIENT STRENGTHS: (choose at  least two) Ability for insight Average or above average intelligence Capable of independent living Communication skills Motivation for treatment/growth Supportive family/friends Work skills  Allergies: No Known Allergies  Home Medications:  (Not in a hospital admission)  OB/GYN Status:  No LMP for male patient.  General Assessment Data [facility] ED Is this a Tele or Face-to-Face Assessment?: Tele Assessment Is this an Initial Assessment or a Re-assessment for this encounter?: Initial Assessment Living Arrangements:  (wife, 4 kids 13, 11, 10, 4) Can pt return to current living arrangement?: Yes Admission Status: Voluntary Is patient capable of signing voluntary admission?: Yes Transfer from: Home Referral Source: Self/Family/Friend     [facility] Living Arrangements:  (wife, 4 kids 4213, 2811, 4010, 4) Name of Psychiatrist:  Museum/gallery curator([facility]) Name of Therapist:  (none)  Education Status Is patient currently in school?: No  Risk to self with the past 6 months Suicidal Ideation: Yes-Currently Present Suicidal Intent: Yes-Currently Present Is patient at risk for suicide?: Yes Suicidal Plan?: Yes-Currently Present Specify Current Suicidal Plan:  (jumping in front of 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) Access to [employer]: Yes Specify Access to Suicidal Means: environment What has been your use of drugs/alcohol within the last 12 months?:  (see SA section) Previous Attempts/Gestures: Yes How many times?: 2 Triggers for Past Attempts:  (when mom died-pt was 18) Intentional Self Injurious Behavior: None Family Suicide History:  (neice attempted) Recent stressful life event(s): Conflict (Comment) (SA, conflict with wife) Persecutory voices/beliefs?: Yes Depression: Yes Depression Symptoms: Insomnia, Tearfulness, Isolating, Guilt, Loss of interest in usual pleasures, Feeling worthless/self pity, Feeling angry/irritable Substance abuse history and/or  treatment for substance abuse?: Yes Suicide prevention information given to non-admitted patients: Not applicable  Risk to Others within the past 6 months Homicidal Ideation: No Thoughts of Harm to Others: No Current Homicidal Intent: No Current Homicidal Plan: No Access to Homicidal Means: No History of harm to others?:  (1 domestic violence charge years ago) Assessment of Violence: None Noted Does patient have access to weapons?: No Criminal Charges Pending?: No Does patient have a court date: No  Psychosis Hallucinations: None noted Delusions: Persecutory (feels like people are out to get him at times)  Mental Status Report Appearance/Hygiene: Unremarkable Eye Contact: Good Motor Activity: Restlessness Speech: Logical/coherent Level of Consciousness: Alert Mood: Depressed, Sad, Anxious Affect: Depressed, Sad, Anxious Anxiety Level: Panic Attacks Panic attack frequency: several times day Most recent panic attack: today Thought Processes: Coherent, Relevant Judgement: Partial Orientation: Person, Place, Situation, Appropriate for developmental age Obsessive Compulsive Thoughts/Behaviors: Moderate (negative self thoughts)  Cognitive Functioning Concentration: Decreased IQ: Average Insight: Fair Impulse Control: Fair Appetite: Poor Weight Loss:  (unknown) Weight Gain:  (0) Sleep: Decreased Total Hours of Sleep: 4 Vegetative Symptoms: None  ADLScreening [facility] Assessment Services) Patient's cognitive ability adequate to safely complete daily  activities?: Yes Patient able to express need for assistance with ADLs?: Yes Independently performs ADLs?: Yes (appropriate for developmental age)  Prior Inpatient Therapy Prior Inpatient Therapy: Yes Prior Therapy Dates: 2 years ago Prior Therapy Fac[facility]Provider[facility] Reason for Treatment: SA  Prior Outpatient Therapy Prior Outpatient Therapy: Yes Prior Therapy Dates: 1 1/2 years Prior Therapy  Facilty/Provider(s): [facility] Reason for Treatment: Bipolar, anxiety  ADL Screening (condition at time of admission) Patient's cognitive ability adequate to safely complete daily activities?: Yes Is the patient deaf or have difficulty hearing?: No Does the patient have difficulty seeing, even when wearing glasses/contacts?: No Does the patient have difficulty concentrating, remembering, or making decisions?: No Patient able to express need for assistance with ADLs?: Yes Does the patient have difficulty dressing or bathing?: No Independently performs ADLs?: Yes (appropriate for developmental age) Does the patient have difficulty walking or climbing stairs?: No Weakness of Legs: None Weakness of Arms/Hands: None  Home Assistive Devices/Equipment Home Assistive Devices/Equipment: None    Abuse/Neglect Assessment (Assessment to be complete while patient is alone) Physical Abuse: Denies Verbal Abuse: Denies Sexual Abuse: Denies Exploitation of patient/patient's resources: Denies Self-Neglect: Denies Values / Beliefs Cultural Requests During Hospitalization: None Spiritual Requests During Hospitalization: None        Additional Information 1:1 In Past 12 Months?: No CIRT Risk: No Elopement Risk: No Does patient have medical clearance?: Yes     Disposition:  Disposition Initial Assessment Completed for this Encounter: Yes Disposition of Patient: Inpatient treatment program  [facility] 03/08/2015 11:14 AM

## 2015-03-08 NOTE — Tx Team (Addendum)
Initial Interdisciplinary Treatment Plan   PATIENT STRESSORS: Marital or family conflict Substance abuse   PATIENT STRENGTHS: Ability for insight Active sense of humor Average or above average intelligence Financial means General fund of knowledge Physical Health Special hobby/interest Work skills   PROBLEM LIST: Problem List/Patient Goals Date to be addressed Date deferred Reason deferred Estimated date of resolution  Depression      Suicide Risk      Substance Abuse.      Medication Non-Compliance                                     DISCHARGE CRITERIA:  Improved stabilization in mood, thinking, and/or behavior Motivation to continue treatment in a less acute level of care Need for constant or close observation no longer present Verbal commitment to aftercare and medication compliance Withdrawal symptoms are absent or subacute and managed without 24-hour nursing intervention  PRELIMINARY DISCHARGE PLAN: Attend 12-step recovery group Outpatient therapy  PATIENT/FAMIILY INVOLVEMENT: This treatment plan has been presented to and reviewed with the patient, Deveron FurlongRudolph T Ellingsen, and/or family member.  The patient and family have been given the opportunity to ask questions and make suggestions.  Alfonse Sprucehorne, Andrea Brooke 03/08/2015, 6:22 PM

## 2015-03-08 NOTE — Progress Notes (Signed)
Patient ID: Deveron FurlongRudolph T Kehres, male   DOB: Dec 10, 1980, 34 y.o.   MRN: [id number]   [facility], pt states that he became suicidal recently due to the fact that he started "drinking and drugging" again and his wife took out a 50b restraining order on the patient, pt states that he was on medications for ADHD, Depression, Anxiety, and Bipolar--however pt states that he stopped taking these meds x4 months ago when he started back drinking and doing drugs, pt is employed and currently married with children whom he was living with before he and his wife had a falling out, pt is hopeful about getting help and denies presently feeling suicidal, pt states that he would like a long term residential treatment for drugs and alcohol after he is discharged.  Pt's only physical complaints are mild anxiety and diarrhea.  Skin and contraband search done, no contraband found and skin is intact, pt was cooperative and pleasant during the admission process, oriented to unit and rules.

## 2015-03-08 NOTE — ED Notes (Signed)
Attempted report at [facility] 

## 2015-03-08 NOTE — ED Notes (Signed)
Patient placed phone call to wife.

## 2015-03-08 NOTE — ED Notes (Signed)
Patient has hx of depression and bipolar disorder.  He has not been taking meds and admits to using drugs and alcohol.  Last drank yesterday.  Last cocaine, molly, and marijuana at 0500 yesterday.  Patient states he does not feel like living anymore.  His wife gave him the ultimatum that he gets help or she is leaving him.  Patient is cooperative.  Patient admits to thinking about using until he died or jumping in front of a moving car

## 2015-03-08 NOTE — ED Notes (Signed)
[facility] to transport.

## 2015-03-08 NOTE — ED Provider Notes (Signed)
CSN: [id number]     Arrival date & time 03/08/15  1013 History   First MD Initiated Contact with Patient 03/08/15 1019     Chief Complaint  Patient presents with  . Suicidal      HPI Pt was seen at 1045. Per pt, c/o gradual onset and persistence of constant polysubstance abuse for the past ** years. LD etoh yesterday. LD cocaine, molly, marijuana 0500 yesterday. Pt states his wife gave him an "ultimatum that he gets help or she is leaving him." Pt states he does not feel like living anymore; plan to "use until he died or jump in front of a moving car." Hx depression and bipolar and has not been taking his meds. Denies SA, no HI, no hallucinations.    Past Medical History  Diagnosis Date  . Acid reflux   . Depression   . Anxiety   . Bipolar 1 disorder   . Polysubstance abuse     etoh, marijuana, molly, cocaine   Past Surgical History  Procedure Laterality Date  . Knee arthroplasty    . Ankle surgery    . Knee surgery      History  Substance Use Topics  . Smoking status: Never Smoker   . Smokeless tobacco: Never Used  . Alcohol Use: Yes     Comment: socially    Review of Systems ROS: Statement: All systems negative except as marked or noted in the HPI; Constitutional: Negative for fever and chills. ; ; Eyes: Negative for eye pain, redness and discharge. ; ; ENMT: Negative for ear pain, hoarseness, nasal congestion, sinus pressure and sore throat. ; ; Cardiovascular: Negative for chest pain, palpitations, diaphoresis, dyspnea and peripheral edema. ; ; Respiratory: Negative for cough, wheezing and stridor. ; ; Gastrointestinal: Negative for nausea, vomiting, diarrhea, abdominal pain, blood in stool, hematemesis, jaundice and rectal bleeding. . ; ; Genitourinary: Negative for dysuria, flank pain and hematuria. ; ; Musculoskeletal: Negative for back pain and neck pain. Negative for swelling and trauma.; ; Skin: Negative for pruritus, rash, abrasions, blisters, bruising and skin  lesion.; ; Neuro: Negative for headache, lightheadedness and neck stiffness. Negative for weakness, altered level of consciousness , altered mental status, extremity weakness, paresthesias, involuntary movement, seizure and syncope. ; Psych:  +SI. No SA, no HI, no hallucinations.    Allergies  Review of patient's allergies indicates no known allergies.  Home Medications   Prior to Admission medications   Medication Sig Start Date End Date Taking? Authorizing Provider  aluminum & magnesium hydroxide-simethicone (MYLANTA) 500-450-40 MG/5ML suspension Take by mouth every 6 (six) hours as needed for indigestion.    Historical Provider, MD  esomeprazole (NEXIUM) 40 MG capsule Take 40 mg by mouth daily at 12 noon.    Historical Provider, MD  HYDROcodone-acetaminophen (NORCO/VICODIN) 5-325 MG per tablet Take 1-2 tablets by mouth every 4 (four) hours as needed. 10/02/13   Teressa Lower, NP  HYDROcodone-acetaminophen (NORCO/VICODIN) 5-325 MG per tablet Take 2 tablets by mouth every 4 (four) hours as needed. 01/12/14   Glynn Octave, MD  ibuprofen (ADVIL,MOTRIN) 600 MG tablet Take 1 tablet (600 mg total) by mouth every 6 (six) hours as needed. 10/15/13   Rolan Bucco, MD  naproxen (NAPROSYN) 500 MG tablet Take 1 tablet (500 mg total) by mouth 2 (two) times daily. 08/19/13   Ivonne Andrew, PA-C  omeprazole (PRILOSEC) 20 MG capsule Take 1 capsule (20 mg total) by mouth daily. 01/12/14   Glynn Octave, MD  omeprazole (PRILOSEC) 20 MG  capsule Take 1 capsule (20 mg total) by mouth daily. 04/14/14   April Palumbo, MD  sucralfate (CARAFATE) 1 GM/10ML suspension Take 10 mLs (1 g total) by mouth 4 (four) times daily -  with meals and at bedtime. 04/14/14   April Palumbo, MD  traMADol (ULTRAM) 50 MG tablet Take 1 tablet (50 mg total) by mouth every 6 (six) hours as needed. 10/15/13   Rolan Bucco, MD   BP 103/57 mmHg  Pulse 69  Temp(Src) 98.2 F (36.8 C) (Oral)  Resp 15  SpO2 99% Physical Exam   1050: Physical examination:  Nursing notes reviewed; Vital signs and O2 SAT reviewed;  Constitutional: Well developed, Well nourished, Well hydrated, In no acute distress; Head:  Normocephalic, atraumatic; Eyes: EOMI, PERRL, No scleral icterus; ENMT: Mouth and pharynx normal, Mucous membranes moist; Neck: Supple, Full range of motion; Cardiovascular: Regular rate and rhythm; Respiratory: Breath sounds clear, No wheezes.  Speaking full sentences with ease, Normal respiratory effort/excursion; Chest:  Movement normal; Abdomen: Nondistended; Extremities: No deformity, No edema.; Neuro: AA&Ox3, Major CN grossly intact.  Speech clear. No gross focal motor deficits in extremities.; Skin: Color normal, Warm, Dry.; Psych:  Full affect. No psychosis, no tremor.     ED Course  Procedures     EKG Interpretation None      MDM  MDM Reviewed: previous chart, nursing note and vitals Reviewed previous: labs Interpretation: labs   Results for orders placed or performed during the hospital encounter of 03/08/15  CBC with Differential  Result Value Ref Range   WBC 6.8 4.0 - 10.5 K/uL   RBC 5.28 4.22 - 5.81 MIL/uL   Hemoglobin 16.2 13.0 - 17.0 g/dL   HCT 16.1 09.6 - 04.5 %   MCV 88.6 78.0 - 100.0 fL   MCH 30.7 26.0 - 34.0 pg   MCHC 34.6 30.0 - 36.0 g/dL   RDW 40.9 81.1 - 91.4 %   Platelets 305 150 - 400 K/uL   Neutrophils Relative % 51 43 - 77 %   Neutro Abs 3.4 1.7 - 7.7 K/uL   Lymphocytes Relative 40 12 - 46 %   Lymphs Abs 2.7 0.7 - 4.0 K/uL   Monocytes Relative 8 3 - 12 %   Monocytes Absolute 0.6 0.1 - 1.0 K/uL   Eosinophils Relative 1 0 - 5 %   Eosinophils Absolute 0.1 0.0 - 0.7 K/uL   Basophils Relative 0 0 - 1 %   Basophils Absolute 0.0 0.0 - 0.1 K/uL  Ethanol  Result Value Ref Range   Alcohol, Ethyl (B) <5 0 - 9 mg/dL  Urine rapid drug screen (hosp performed)  Result Value Ref Range   Opiates NONE DETECTED NONE DETECTED   Cocaine POSITIVE (A) NONE DETECTED   Benzodiazepines  NONE DETECTED NONE DETECTED   Amphetamines NONE DETECTED NONE DETECTED   Tetrahydrocannabinol POSITIVE (A) NONE DETECTED   Barbiturates NONE DETECTED NONE DETECTED  I-stat Chem 8, ED  Result Value Ref Range   Sodium 138 135 - 145 mmol/L   Potassium 4.2 3.5 - 5.1 mmol/L   Chloride 104 96 - 112 mmol/L   BUN 17 6 - 23 mg/dL   Creatinine, Ser 7.82 0.50 - 1.35 mg/dL   Glucose, Bld 956 (H) 70 - 99 mg/dL   Calcium, Ion 2.13 0.86 - 1.23 mmol/L   TCO2 20 0 - 100 mmol/L   Hemoglobin 17.7 (H) 13.0 - 17.0 g/dL   HCT 57.8 46.9 - 62.9 %    1120:  TTS has evaluated pt: recommends inpt treatment, placement pending. Holding orders written.   Samuel JesterKathleen Dara Beidleman, DO 03/08/15 1442

## 2015-03-08 NOTE — Progress Notes (Signed)
[facility] per Julieanne Cottonina, AC, Dr. Dub MikesLugo. Bed available 4pm.   [facility] Jesse SansJanee regarding pt's disposition.   Ilean SkillMeghan Taye Cato, MSW, LCSWA Clinical Social Work, Disposition  03/08/2015 [phone]

## 2015-03-08 NOTE — ED Notes (Signed)
Patient received Dinner Tray.

## 2015-03-08 NOTE — ED Provider Notes (Signed)
2:40 PM [facility].  Well appearing.  All questions answered.   Clinical Impression: 1. Depression   2. Suicidal ideation   3. Polysubstance abuse       Blake DivineJohn Matasha Smigelski, MD 03/08/15 1440

## 2015-03-08 NOTE — BH Assessment (Signed)
[facility] Assessment Progress Note Spoke with Dr. Clarene DukeMcManus and took history of pt; asked ED staff to put machine in room for teleassessment.

## 2015-03-09 DIAGNOSIS — R45851 Suicidal ideations: Secondary | ICD-10-CM

## 2015-03-09 DIAGNOSIS — F3162 Bipolar disorder, current episode mixed, moderate: Secondary | ICD-10-CM | POA: Insufficient documentation

## 2015-03-09 DIAGNOSIS — F1910 Other psychoactive substance abuse, uncomplicated: Secondary | ICD-10-CM

## 2015-03-09 MED ORDER — QUETIAPINE FUMARATE 100 MG PO TABS
100.0000 mg | ORAL_TABLET | Freq: Every day | ORAL | Status: DC
  Administered 2015-03-09 – 2015-03-11 (×3): 100 mg via ORAL
  Filled 2015-03-09 (×5): qty 1

## 2015-03-09 MED ORDER — HYDROXYZINE HCL 25 MG PO TABS
25.0000 mg | ORAL_TABLET | Freq: Three times a day (TID) | ORAL | Status: DC | PRN
  Administered 2015-03-11 – 2015-03-13 (×5): 25 mg via ORAL
  Filled 2015-03-09 (×3): qty 1
  Filled 2015-03-09: qty 20
  Filled 2015-03-09 (×2): qty 1

## 2015-03-09 MED ORDER — DIVALPROEX SODIUM ER 250 MG PO TB24: 250.0000 mg | ORAL_TABLET | Freq: Every day | ORAL | Status: DC

## 2015-03-09 MED ORDER — DIVALPROEX SODIUM ER 250 MG PO TB24
250.0000 mg | ORAL_TABLET | Freq: Two times a day (BID) | ORAL | Status: DC
  Administered 2015-03-09 – 2015-03-12 (×7): 250 mg via ORAL
  Filled 2015-03-09 (×11): qty 1

## 2015-03-09 NOTE — Progress Notes (Signed)
D.  Pt bright and pleasant on approach, denies complaints at this time.  Positive for evening AA group, very engaged in group, and appropriately interactive.  Interacting appropriately with peers on the unit.  Denies SI/HI/hallucinations at this time.  A.  Support and encouragement offered  R.  Pt remains safe on the unit, will continue to monitor.

## 2015-03-09 NOTE — [facility] Suicide Risk Assessment (Signed)
[facility]   Nursing information obtained from:  Patient Demographic factors:  Male Current Mental Status:  Suicidal ideation indicated by patient, Suicide plan (denies presently) Loss Factors:  Loss of significant relationship Historical Factors:  Impulsivity Risk Reduction Factors:  Living with another person, especially a relative, Responsible for children under 34 years of age, Employed, Positive social support Total Time spent with patient: 1 hour Principal Problem: Substance abuse Diagnosis:   Patient Active Problem List   Diagnosis Date Noted  . Substance abuse [F19.10] 03/09/2015  . Depression [F32.9] 03/08/2015     Continued Clinical Symptoms:  Alcohol Use Disorder Identification Test Final Score (AUDIT): 24 The "Alcohol Use Disorders Identification Test", Guidelines for Use in Primary Care, Second Edition.  World Science writerHealth Organization [facility](WHO). Score between 0-7:  no or low risk or alcohol related problems. Score between 8-15:  moderate risk of alcohol related problems. Score between 16-19:  high risk of alcohol related problems. Score 20 or above:  warrants further diagnostic evaluation for alcohol dependence and treatment.   CLINICAL FACTORS:   Bipolar Disorder:   Mixed State Depression:   Anhedonia Comorbid alcohol abuse/dependence Hopelessness Impulsivity Insomnia Recent sense of peace/wellbeing Alcohol/Substance Abuse/Dependencies More than one psychiatric diagnosis Currently Psychotic Unstable or Poor Therapeutic Relationship Previous Psychiatric Diagnoses and Treatments   Musculoskeletal: Strength & Muscle Tone: within normal limits Gait & Station: normal Patient leans: N/A  Psychiatric Specialty Exam: Physical Exam  ROS  Blood pressure 127/78, pulse 78, temperature 97.8 F (36.6 C), temperature source Oral, resp. rate 20, height 6\' 1"  (1.854 m), weight 111.585 kg (246 lb), SpO2 100 %.Body mass index is 32.46 kg/(m^2).   General Appearance: Casual  Eye Contact::  Fair  Speech:  Pressured  Volume:  Increased  Mood:  Angry, Anxious, Depressed, Hopeless and Irritable  Affect:  Constricted, Depressed, Labile and Restricted  Thought Process:  Circumstantial and Loose  Orientation:  Full (Time, Place, and Person)  Thought Content:  Delusions, Paranoid Ideation and Rumination  Suicidal Thoughts:  Yes.  with intent/plan  Homicidal Thoughts:  No  Memory:  Immediate;   Fair Recent;   Fair Remote;   Fair  Judgement:  Impaired  Insight:  Lacking  Psychomotor Activity:  Increased and Restlessness  Concentration:  Fair  Recall:  [employer]Fair  Fund of Knowledge:Fair  Language: Fair  Akathisia:  No  Handed:  Right  AIMS (if indicated):     Assets:  Communication Skills Desire for Improvement Social Support  Sleep:     Cognition: WNL  ADL's:  Intact     COGNITIVE FEATURES THAT CONTRIBUTE TO RISK:  Closed-mindedness, Polarized thinking and Thought constriction (tunnel vision)    SUICIDE RISK:   Moderate:  Frequent suicidal ideation with limited intensity, and duration, some specificity in terms of plans, no associated intent, good self-control, limited dysphoria/symptomatology, some risk factors present, and identifiable protective factors, including available and accessible social support.  PLAN OF CARE: Admitted to behavioral Health Center.  Please see history and physical for more information  Medical Decision Making:  Review of Psycho-Social Stressors (1), Review or order clinical lab tests (1), Decision to obtain old records (1), Review and summation of old records (2), Established Problem, Worsening (2), Review of Medication Regimen & Side Effects (2) and Review of New Medication or Change in Dosage (2)  I certify that inpatient services furnished can reasonably be expected to improve the patient's condition.   Gabriel Barnes T. 03/09/2015, 12:06 PM

## 2015-03-09 NOTE — Progress Notes (Signed)
D. Pt pleasant on approach, denies complaints at this time other than that he has had difficulty sleeping.  Positive for evening AA group, interacting appropriately with peers on the unit.  Denies SI/HI/hallucinatons at this time.  A.  Support and encouragement offered.  Medication given as ordered for sleep.  R.  Pt remains safe on the unit, will continue to monitor.
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
## 2015-03-09 NOTE — H&P (Addendum)
Psychiatric Admission Assessment Adult  Patient Identification: Gabriel Barnes MRN:  [id number] Date of Evaluation:  03/09/2015 Chief Complaint:  ANXIETY DISORDER NOS BIPOLAR,DEPRESSED ALCOHOL USE DISORDER Principal Diagnosis: Substance abuse Diagnosis:   Patient Active Problem List   Diagnosis Date Noted  . Depression [F32.9] 03/08/2015   History of Present Illness:: Patient is 34 year old African-American, married, employed man who [facility] due to severe depression and having thoughts to kill himself by walking into traffic.  He is using drugs and alcohol.  He admitted for past few months he's been not taking his medication and spending money buying crack cocaine and alcohol.  He married in August and now his wife is given him an ultimatum that if he do not get better she would leave.  Patient is using marijuana, Molly's and binging on cocaine and alcohol.  He claimed himself of weekend warrior.  His wife is supportive.  He admitted some time paranoia, hallucination, anger, severe mood swing and irritability.  He has diagnosed with bipolar disorder in the past and he was seeing [facility] however he has not taken his medication in 3 months.  He admitted poor sleep, paranoia, feeling people talking about him.  He admitted feeling hopeless helpless and worthless.  He wants to restart his medication.  Patient has at least 2 psychiatric hospitalization in the past when he wasn't elevated and also in [place].  He endorse taking overdose on the drugs and also tried to walk into the traffic in [place].  Patient work as a Regulatory affairs officer.  He has 3 kids who lives out of state.  He is living with his wife who has 4 kids.  His UDS is positive for cocaine  Elements:  Location:  [facility]. Quality:  Unable to function. Severity:  Moderate. Timing:  The past 3 months. Duration:  Ongoing. Associated Signs/Symptoms: Depression Symptoms:  depressed  mood, anhedonia, insomnia, hypersomnia, psychomotor agitation, fatigue, feelings of worthlessness/guilt, difficulty concentrating, hopelessness, recurrent thoughts of death, suicidal thoughts with specific plan, loss of energy/fatigue, disturbed sleep, (Hypo) Manic Symptoms:  Elevated Mood, Flight of Ideas, Impulsivity, Irritable Mood, Anxiety Symptoms:  Social Anxiety, Psychotic Symptoms:  Rumination PTSD Symptoms: Negative Total Time spent with patient: 45 minutes  Past Medical History:  Past Medical History  Diagnosis Date  . Acid reflux   . Depression   . Anxiety   . Bipolar 1 disorder   . Polysubstance abuse     etoh, marijuana, molly, cocaine    Past Surgical History  Procedure Laterality Date  . Knee arthroplasty    . Ankle surgery    . Knee surgery     Family History: History reviewed. No pertinent family history. Social History:  History  Alcohol Use  . Yes    Comment: daily, bingwe drinks on the weekend     History  Drug Use  . 1.00 per week  . Special: Marijuana, Cocaine    Comment: molly    History   Social History  . Marital Status: Single    Spouse Name: N/A  . Number of Children: N/A  . Years of Education: N/A   Social History Main Topics  . Smoking status: Never Smoker   . Smokeless tobacco: Never Used  . Alcohol Use: Yes     Comment: daily, bingwe drinks on the weekend  . Drug Use: 1.00 per week    Special: Marijuana, Cocaine     Comment: molly  . Sexual Activity: Not on file  Other Topics Concern  . None   Social History Narrative   Additional Social History:                          Musculoskeletal: Strength & Muscle Tone: within normal limits Gait & Station: normal Patient leans: N/A  Psychiatric Specialty Exam: Physical Exam  Review of Systems  Constitutional: Positive for malaise/fatigue.  HENT: Negative.   Cardiovascular: Negative.   Musculoskeletal: Negative.   Skin: Negative.    Neurological: Negative.   Psychiatric/Behavioral: Positive for depression, suicidal ideas and substance abuse. The patient is nervous/anxious and has insomnia.     Blood pressure 127/82, pulse 91, temperature 97.8 F (36.6 C), temperature source Oral, resp. rate 20, height  (1.854 m), weight 111.585 kg (246 lb), SpO2 100 %.Body mass index is 32.46 kg/(m^2).  General Appearance: Casual  Eye Contact::  Fair  Speech:  Pressured  Volume:  Increased  Mood:  Angry, Anxious, Depressed, Dysphoric, Hopeless and Irritable  Affect:  Constricted, Depressed, Labile and Restricted  Thought Process:  Circumstantial and Loose  Orientation:  Full (Time, Place, and Person)  Thought Content:  Delusions, Paranoid Ideation and Rumination  Suicidal Thoughts:  Yes.  with intent/plan  Homicidal Thoughts:  No  Memory:  Immediate;   Fair Recent;   Fair Remote;   Fair  Judgement:  Impaired  Insight:  Lacking  Psychomotor Activity:  Increased and Restlessness  Concentration:  Fair  Recall:  [employer] of Knowledge:Fair  Language: Fair  Akathisia:  No  Handed:  Right  AIMS (if indicated):     Assets:  Communication Skills Desire for Improvement Housing Social Support  ADL's:  Intact  Cognition: WNL  Sleep:      Risk to Self: Is patient at risk for suicide?: No What has been your use of drugs/alcohol within the last 12 months?: Dail use of alcohol (2 24 oz beers), Mollys ( 1 gram) and THC (2 Blunts) plus additional use of liguor (fifth or more) and cocaine (1-2 grams) on weekends Risk to Others:   Prior Inpatient Therapy:   Prior Outpatient Therapy:    Alcohol Screening: 1. How often do you have a drink containing alcohol?: 4 or more times a week 2. How many drinks containing alcohol do you have on a typical day when you are drinking?: 5 or 6 3. How often do you have six or more drinks on one occasion?: Weekly Preliminary Score: 5 4. How often during the last year have you found that you were  not able to stop drinking once you had started?: Weekly 5. How often during the last year have you failed to do what was normally expected from you becasue of drinking?: Daily or almost daily 6. How often during the last year have you needed a first drink in the morning to get yourself going after a heavy drinking session?: Never 7. How often during the last year have you had a feeling of guilt of remorse after drinking?: Weekly 8. How often during the last year have you been unable to remember what happened the night before because you had been drinking?: Less than monthly 9. Have you or someone else been injured as a result of your drinking?: No 10. Has a relative or friend or a doctor or another health worker been concerned about your drinking or suggested you cut down?: Yes, during the last year Alcohol Use Disorder Identification Test Final Score (AUDIT): 24 Brief  Intervention: Yes  Allergies:  No Known Allergies Lab Results:  Results for orders placed or performed during the hospital encounter of 03/08/15 (from the past 48 hour(s))  CBC with Differential     Status: None   Collection Time: 03/08/15 11:29 AM  Result Value Ref Range   WBC 6.8 4.0 - 10.5 K/uL   RBC 5.28 4.22 - 5.81 MIL/uL   Hemoglobin 16.2 13.0 - 17.0 g/dL   HCT 16.1 09.6 - 04.5 %   MCV 88.6 78.0 - 100.0 fL   MCH 30.7 26.0 - 34.0 pg   MCHC 34.6 30.0 - 36.0 g/dL   RDW 40.9 81.1 - 91.4 %   Platelets 305 150 - 400 K/uL   Neutrophils Relative % 51 43 - 77 %   Neutro Abs 3.4 1.7 - 7.7 K/uL   Lymphocytes Relative 40 12 - 46 %   Lymphs Abs 2.7 0.7 - 4.0 K/uL   Monocytes Relative 8 3 - 12 %   Monocytes Absolute 0.6 0.1 - 1.0 K/uL   Eosinophils Relative 1 0 - 5 %   Eosinophils Absolute 0.1 0.0 - 0.7 K/uL   Basophils Relative 0 0 - 1 %   Basophils Absolute 0.0 0.0 - 0.1 K/uL  Ethanol     Status: None   Collection Time: 03/08/15 11:29 AM  Result Value Ref Range   Alcohol, Ethyl (B) <5 0 - 9 mg/dL    Comment:         LOWEST DETECTABLE LIMIT FOR SERUM ALCOHOL IS 11 mg/dL FOR MEDICAL PURPOSES ONLY   I-stat Chem 8, ED     Status: Abnormal   Collection Time: 03/08/15 11:43 AM  Result Value Ref Range   Sodium 138 135 - 145 mmol/L   Potassium 4.2 3.5 - 5.1 mmol/L   Chloride 104 96 - 112 mmol/L   BUN 17 6 - 23 mg/dL   Creatinine, Ser 7.82 0.50 - 1.35 mg/dL   Glucose, Bld 956 (H) 70 - 99 mg/dL   Calcium, Ion 2.13 0.86 - 1.23 mmol/L   TCO2 20 0 - 100 mmol/L   Hemoglobin 17.7 (H) 13.0 - 17.0 g/dL   HCT 57.8 46.9 - 62.9 %  Urine rapid drug screen (hosp performed)     Status: Abnormal   Collection Time: 03/08/15 11:50 AM  Result Value Ref Range   Opiates NONE DETECTED NONE DETECTED   Cocaine POSITIVE (A) NONE DETECTED   Benzodiazepines NONE DETECTED NONE DETECTED   Amphetamines NONE DETECTED NONE DETECTED   Tetrahydrocannabinol POSITIVE (A) NONE DETECTED   Barbiturates NONE DETECTED NONE DETECTED    Comment:        DRUG SCREEN FOR MEDICAL PURPOSES ONLY.  IF CONFIRMATION IS NEEDED FOR ANY PURPOSE, NOTIFY LAB WITHIN 5 DAYS.        LOWEST DETECTABLE LIMITS FOR URINE DRUG SCREEN Drug Class       Cutoff (ng/mL) Amphetamine      1000 Barbiturate      200 Benzodiazepine   200 Tricyclics       300 Opiates          300 Cocaine          300 THC              50    Current Medications: Current Facility-Administered Medications  Medication Dose Route Frequency Provider Last Rate Last Dose  . acetaminophen (TYLENOL) tablet 650 mg  650 mg Oral Q4H PRN Adonis Brook, NP      .  alum & mag hydroxide-simeth (MAALOX/MYLANTA) 200-200-20 MG/5ML suspension 30 mL  30 mL Oral PRN Adonis Brook, NP      . divalproex (DEPAKOTE ER) 24 hr tablet 250 mg  250 mg Oral BID Cleotis Nipper, MD      . hydrOXYzine (ATARAX/VISTARIL) tablet 25 mg  25 mg Oral TID PRN Cleotis Nipper, MD      . ibuprofen (ADVIL,MOTRIN) tablet 400 mg  400 mg Oral Q8H PRN Adonis Brook, NP      . LORazepam (ATIVAN) tablet 0-4 mg  0-4 mg Oral 4  times per day Adonis Brook, NP   0 mg at 03/08/15 1848   Followed by  . [START ON 03/10/2015] LORazepam (ATIVAN) tablet 0-4 mg  0-4 mg Oral Q12H Adonis Brook, NP      . LORazepam (ATIVAN) tablet 1 mg  1 mg Oral Q8H PRN Adonis Brook, NP   1 mg at 03/08/15 2122  . magnesium hydroxide (MILK OF MAGNESIA) suspension 30 mL  30 mL Oral Daily PRN Adonis Brook, NP      . nicotine (NICODERM CQ - dosed in mg/24 hours) patch 21 mg  21 mg Transdermal Daily PRN Adonis Brook, NP      . ondansetron [facility]) tablet 4 mg  4 mg Oral Q8H PRN Adonis Brook, NP      . QUEtiapine (SEROQUEL) tablet 100 mg  100 mg Oral QHS Cleotis Nipper, MD      . thiamine (VITAMIN B-1) tablet 100 mg  100 mg Oral Daily Adonis Brook, NP   100 mg at 03/09/15 1610   Or  . thiamine (B-1) injection 100 mg  100 mg Intravenous Daily Adonis Brook, NP      . traZODone (DESYREL) tablet 50 mg  50 mg Oral QHS PRN Adonis Brook, NP   50 mg at 03/08/15 2123   PTA Medications: Prescriptions prior to admission  Medication Sig Dispense Refill Last Dose  . divalproex (DEPAKOTE ER) 250 MG 24 hr tablet Take 250 mg by mouth at bedtime.   Past Month at Unknown time  . hydrOXYzine (ATARAX/VISTARIL) 25 MG tablet Take 25 mg by mouth 3 (three) times daily as needed for anxiety.   03/04/2015  . [DISCONTINUED] ibuprofen (ADVIL,MOTRIN) 600 MG tablet Take 1 tablet (600 mg total) by mouth every 6 (six) hours as needed. 30 tablet 0 unknown  . [DISCONTINUED] naproxen (NAPROSYN) 500 MG tablet Take 1 tablet (500 mg total) by mouth 2 (two) times daily. 30 tablet 0 unknown  . [DISCONTINUED] QUEtiapine (SEROQUEL XR) 50 MG TB24 24 hr tablet Take 50 mg by mouth at bedtime.   Past Month at Unknown time    Previous Psychotropic Medications: Yes   Substance Abuse History in the last 12 months:  Yes.      Consequences of Substance Abuse: Family Consequences:  He is concerned his wife may leave him Withdrawal Symptoms:    Cramps Headaches Tremors  Results for orders placed or performed during the hospital encounter of 03/08/15 (from the past 72 hour(s))  CBC with Differential     Status: None   Collection Time: 03/08/15 11:29 AM  Result Value Ref Range   WBC 6.8 4.0 - 10.5 K/uL   RBC 5.28 4.22 - 5.81 MIL/uL   Hemoglobin 16.2 13.0 - 17.0 g/dL   HCT 96.0 45.4 - 09.8 %   MCV 88.6 78.0 - 100.0 fL   MCH 30.7 26.0 - 34.0 pg   MCHC 34.6 30.0 - 36.0 g/dL  RDW 13.6 11.5 - 15.5 %   Platelets 305 150 - 400 K/uL   Neutrophils Relative % 51 43 - 77 %   Neutro Abs 3.4 1.7 - 7.7 K/uL   Lymphocytes Relative 40 12 - 46 %   Lymphs Abs 2.7 0.7 - 4.0 K/uL   Monocytes Relative 8 3 - 12 %   Monocytes Absolute 0.6 0.1 - 1.0 K/uL   Eosinophils Relative 1 0 - 5 %   Eosinophils Absolute 0.1 0.0 - 0.7 K/uL   Basophils Relative 0 0 - 1 %   Basophils Absolute 0.0 0.0 - 0.1 K/uL  Ethanol     Status: None   Collection Time: 03/08/15 11:29 AM  Result Value Ref Range   Alcohol, Ethyl (B) <5 0 - 9 mg/dL    Comment:        LOWEST DETECTABLE LIMIT FOR SERUM ALCOHOL IS 11 mg/dL FOR MEDICAL PURPOSES ONLY   I-stat Chem 8, ED     Status: Abnormal   Collection Time: 03/08/15 11:43 AM  Result Value Ref Range   Sodium 138 135 - 145 mmol/L   Potassium 4.2 3.5 - 5.1 mmol/L   Chloride 104 96 - 112 mmol/L   BUN 17 6 - 23 mg/dL   Creatinine, Ser 1.61 0.50 - 1.35 mg/dL   Glucose, Bld 096 (H) 70 - 99 mg/dL   Calcium, Ion 0.45 4.09 - 1.23 mmol/L   TCO2 20 0 - 100 mmol/L   Hemoglobin 17.7 (H) 13.0 - 17.0 g/dL   HCT 81.1 91.4 - 78.2 %  Urine rapid drug screen (hosp performed)     Status: Abnormal   Collection Time: 03/08/15 11:50 AM  Result Value Ref Range   Opiates NONE DETECTED NONE DETECTED   Cocaine POSITIVE (A) NONE DETECTED   Benzodiazepines NONE DETECTED NONE DETECTED   Amphetamines NONE DETECTED NONE DETECTED   Tetrahydrocannabinol POSITIVE (A) NONE DETECTED   Barbiturates NONE DETECTED NONE DETECTED    Comment:         DRUG SCREEN FOR MEDICAL PURPOSES ONLY.  IF CONFIRMATION IS NEEDED FOR ANY PURPOSE, NOTIFY LAB WITHIN 5 DAYS.        LOWEST DETECTABLE LIMITS FOR URINE DRUG SCREEN Drug Class       Cutoff (ng/mL) Amphetamine      1000 Barbiturate      200 Benzodiazepine   200 Tricyclics       300 Opiates          300 Cocaine          300 THC              50     Observation Level/Precautions:  15 minute checks  Laboratory:  CBC Chemistry Profile Folic Acid GGT HbAIC UDS UA Vitamin B-12  Psychotherapy:    Medications:    Consultations:    Discharge Concerns:    Estimated LOS:  Other:     Psychological Evaluations: Yes   Treatment Plan Summary: [facility]  for crisis management and stabilization. 2.  Medication management to reduce symptoms to baseline and improved the patient's overall level of functioning.  Closely monitor the side effects, efficacy and therapeutic response of medication. 3.  Treat health problem as indicated.  Start Seroquel 100 mg at bedtime and Depakote 250 mg twice a day.  Discussed medication side effects and benefits.  Continue hydroxyzine for when necessary. 4.  Developed treatment plan to decrease the risk of relapse upon discharge and to  reduce the need for readmission. 5.  Psychosocial education regarding relapse prevention in self-care. 6.  Healthcare followup as needed for medical problems and called consults as indicated.   7.  Increase collateral information. 8.  Restart home medication where appropriate 9. Encouraged to participate and verbalize into group milieu therapy.   Medical Decision Making:  New problem, with additional work up planned, Review of Psycho-Social Stressors (1), Review or order clinical lab tests (1), Decision to obtain old records (1), Review and summation of old records (2), Established Problem, Worsening (2), Review of Medication Regimen & Side Effects (2) and Review of New Medication or Change in Dosage  (2)  I certify that inpatient services furnished can reasonably be expected to improve the patient's condition.   Kenyatta Gloeckner T. 4/16/201611:48 AM

## 2015-03-09 NOTE — Progress Notes (Signed)
D) Pt has been attending the groups and interacting with his peers appropriately. Rates his depression, hopelessness and anxiety all at a 5. Denies SI and HI. States he is feeling a bit better and has refused the Ativan protocol today. Pt does endorse some feelings of depression. Affect is flat and mood depressed at a times. Wants very much to have his medications adjusted so he can feel "like myself again. A) Given support, reassurance and praise. Provided with a 1:1. Encouragement given. R) Denies SI and HI. Attending the program and interacting with his peers appropraiately.

## 2015-03-09 NOTE — [facility] Group Notes (Signed)
.  [facility] Group Notes:  (Clinical Social Work)  03/09/2015   1:15-2:15PM  Summary of Progress/Problems:   The main focus of today's process group was for the patient to identify ways in which they have sabotaged their own mental health wellness/recovery.  Motivational interviewing and a handout were used to explore the benefits and costs of their self-sabotaging behavior as well as the benefits and costs of changing this behavior.  The Stages of Change were explained to the group using a handout, and patients identified where they are with regard to changing self-defeating behaviors.  The patient expressed he self-sabotages with denial, lying to himself, procrastination and negative self-talk.   Type of Therapy:  Process Group  Participation Level:  Active  Participation Quality:  Attentive and Sharing  Affect:  Blunted  Cognitive:  Appropriate and Oriented  Insight:  Engaged  Engagement in Therapy:  Engaged  Modes of Intervention:  Education, Motivational Interviewing   Gabriel MantleMareida Grossman-Orr, LCSW 03/09/2015, 4:00pm

## 2015-03-09 NOTE — Progress Notes (Signed)
Adult Psychoeducational Group Note  Date:  03/09/2015 Time: 10:30am  Group Topic/Focus:  Healthy Communication:   The focus of this group is to discuss communication, barriers to communication, as well as healthy ways to communicate with others. Making Healthy Choices:   The focus of this group is to help patients identify negative/unhealthy choices they were using prior to admission and identify positive/healthier coping strategies to replace them upon discharge. Managing Feelings:   The focus of this group is to identify what feelings patients have difficulty handling and develop a plan to handle them in a healthier way upon discharge. Primary and Secondary Emotions:   The focus of this group is to discuss the difference between primary and secondary emotions.  Participation Level:  Active  Participation Quality:  Attentive  Affect:  Flat  Cognitive:  Appropriate  Insight: Improving  Engagement in Group:  Engaged  Modes of Intervention:  Activity, Discussion, Education and Support  Additional Comments:  Pt able to identify positive and negative personal coping skills. Pt shows insight into his actions and emotions.   Gabriel Barnes, Gabriel Barnes E 03/09/2015, 12:24 PM

## 2015-03-09 NOTE — Progress Notes (Signed)
Adult Psychoeducational Group Note  Date:  03/09/2015 Time: 09:30am  Group Topic/Focus:  Orientation:   The focus of this group is to educate the patient on the purpose and policies of crisis stabilization and provide a format to answer questions about their admission.  The group details unit policies and expectations of patients while admitted.  Participation Level:  Minimal  Participation Quality:  Attentive  Affect:  Flat  Cognitive:  Oriented  Insight: Limited  Engagement in Group:  Limited  Modes of Intervention:  Discussion, Education, Orientation and Support  Additional Comments:  Pt attended latter part of group. Pt was with MD before attendance. Pt minimal in response.   Aurora Maskwyman, Taelyn Broecker E 03/09/2015, 10:30 AM

## 2015-03-09 NOTE — Progress Notes (Signed)
Patient did attend the evening speaker AA meeting.  

## 2015-03-10 NOTE — Progress Notes (Signed)
[facility] MD Progress Note  03/10/2015 10:13 AM Gabriel Barnes  MRN:  [id number]   Subjective:  I'm very upset because my wife told me that I cannot come back.  I did not sleep last night.  Objective: Patient seen chart reviewed.  Patient remained very anxious and nervous.  He continued to endorse depression and having suicidal thoughts.  He talked with his wife yesterday and his wife told that he cannot come back home because she is tired using his drugs and alcohol.  Patient is very upset after that.  He wants to get better.  He feels medicine helps some of his insomnia and paranoia.  He has mild tremors and shakes.  He continued to endorse suicidal thoughts.  He is going to the group but he has limited participation.  He is hoping to go long-term drug treatment program.  Principal Problem: Substance abuse Diagnosis:   Patient Active Problem List   Diagnosis Date Noted  . Substance abuse [F19.10] 03/09/2015  . Bipolar 1 disorder, mixed, moderate [F31.62]   . Depression [F32.9] 03/08/2015   Total Time spent with patient: 30 minutes   Past Medical History:  Past Medical History  Diagnosis Date  . Acid reflux   . Depression   . Anxiety   . Bipolar 1 disorder   . Polysubstance abuse     etoh, marijuana, molly, cocaine    Past Surgical History  Procedure Laterality Date  . Knee arthroplasty    . Ankle surgery    . Knee surgery     Family History: History reviewed. No pertinent family history. Social History:  History  Alcohol Use  . Yes    Comment: daily, bingwe drinks on the weekend     History  Drug Use  . 1.00 per week  . Special: Marijuana, Cocaine    Comment: molly    History   Social History  . Marital Status: Single    Spouse Name: N/A  . Number of Children: N/A  . Years of Education: N/A   Social History Main Topics  . Smoking status: Never Smoker   . Smokeless tobacco: Never Used  . Alcohol Use: Yes     Comment: daily, bingwe drinks on the weekend  .  Drug Use: 1.00 per week    Special: Marijuana, Cocaine     Comment: molly  . Sexual Activity: Not on file   Other Topics Concern  . None   Social History Narrative   Additional History:    Sleep: Fair  Appetite:  Fair   Assessment:   Musculoskeletal: Strength & Muscle Tone: within normal limits Gait & Station: normal Patient leans: N/A   Psychiatric Specialty Exam: Physical Exam  Review of Systems  Skin: Negative.   Neurological: Positive for tremors.  Psychiatric/Behavioral: Positive for depression, suicidal ideas and hallucinations. The patient is nervous/anxious and has insomnia.     Blood pressure 117/83, pulse 92, temperature 97.7 F (36.5 C), temperature source Oral, resp. rate 20, height  (1.854 m), weight 111.585 kg (246 lb), SpO2 100 %.Body mass index is 32.46 kg/(m^2).  General Appearance: Casual  Eye Contact::  Fair  Speech:  Slow  Volume:  Decreased  Mood:  Anxious, Depressed and Dysphoric  Affect:  Constricted and Depressed  Thought Process:  Intact  Orientation:  Full (Time, Place, and Person)  Thought Content:  Delusions, Paranoid Ideation and Rumination  Suicidal Thoughts:  Yes.  with intent/plan  Homicidal Thoughts:  No  Memory:  Immediate;  Fair Recent;   Fair Remote;   Fair  Judgement:  Intact  Insight:  Fair  Psychomotor Activity:  Decreased  Concentration:  Fair  Recall:  [employer]  Fund of Knowledge:Fair  Language: Fair  Akathisia:  No  Handed:  Right  AIMS (if indicated):     Assets:  Communication Skills Desire for Improvement  ADL's:  Intact  Cognition: WNL  Sleep:  Number of Hours: 4.5     Current Medications: Current Facility-Administered Medications  Medication Dose Route Frequency Provider Last Rate Last Dose  . acetaminophen (TYLENOL) tablet 650 mg  650 mg Oral Q4H PRN Adonis BrookSheila Agustin, NP      . alum & mag hydroxide-simeth (MAALOX/MYLANTA) 200-200-20 MG/5ML suspension 30 mL  30 mL Oral PRN Adonis BrookSheila Agustin, NP      .  divalproex (DEPAKOTE ER) 24 hr tablet 250 mg  250 mg Oral BID Cleotis NipperSyed T Loistine Eberlin, MD   250 mg at 03/09/15 1709  . hydrOXYzine (ATARAX/VISTARIL) tablet 25 mg  25 mg Oral TID PRN Cleotis NipperSyed T Pearse Shiffler, MD      . ibuprofen (ADVIL,MOTRIN) tablet 400 mg  400 mg Oral Q8H PRN Adonis BrookSheila Agustin, NP      . LORazepam (ATIVAN) tablet 0-4 mg  0-4 mg Oral 4 times per day Adonis BrookSheila Agustin, NP   0 mg at 03/08/15 1848   Followed by  . LORazepam (ATIVAN) tablet 0-4 mg  0-4 mg Oral Q12H Adonis BrookSheila Agustin, NP      . LORazepam (ATIVAN) tablet 1 mg  1 mg Oral Q8H PRN Adonis BrookSheila Agustin, NP   1 mg at 03/08/15 2122  . magnesium hydroxide (MILK OF MAGNESIA) suspension 30 mL  30 mL Oral Daily PRN Adonis BrookSheila Agustin, NP      . nicotine (NICODERM CQ - dosed in mg/24 hours) patch 21 mg  21 mg Transdermal Daily PRN Adonis BrookSheila Agustin, NP      . ondansetron [facility](ZOFRAN) tablet 4 mg  4 mg Oral Q8H PRN Adonis BrookSheila Agustin, NP      . QUEtiapine (SEROQUEL) tablet 100 mg  100 mg Oral QHS Cleotis NipperSyed T Sonji Starkes, MD   100 mg at 03/09/15 2132  . thiamine (VITAMIN B-1) tablet 100 mg  100 mg Oral Daily Adonis BrookSheila Agustin, NP   100 mg at 03/09/15 16100824   Or  . thiamine (B-1) injection 100 mg  100 mg Intravenous Daily Adonis BrookSheila Agustin, NP      . traZODone (DESYREL) tablet 50 mg  50 mg Oral QHS PRN Adonis BrookSheila Agustin, NP   50 mg at 03/09/15 2132    Lab Results:  Results for orders placed or performed during the hospital encounter of 03/08/15 (from the past 48 hour(s))  CBC with Differential     Status: None   Collection Time: 03/08/15 11:29 AM  Result Value Ref Range   WBC 6.8 4.0 - 10.5 K/uL   RBC 5.28 4.22 - 5.81 MIL/uL   Hemoglobin 16.2 13.0 - 17.0 g/dL   HCT 96.046.8 45.439.0 - 09.852.0 %   MCV 88.6 78.0 - 100.0 fL   MCH 30.7 26.0 - 34.0 pg   MCHC 34.6 30.0 - 36.0 g/dL   RDW 11.913.6 14.711.5 - 82.915.5 %   Platelets 305 150 - 400 K/uL   Neutrophils Relative % 51 43 - 77 %   Neutro Abs 3.4 1.7 - 7.7 K/uL   Lymphocytes Relative 40 12 - 46 %   Lymphs Abs 2.7 0.7 - 4.0 K/uL   Monocytes Relative 8 3 - 12 %  Monocytes Absolute 0.6 0.1 - 1.0 K/uL   Eosinophils Relative 1 0 - 5 %   Eosinophils Absolute 0.1 0.0 - 0.7 K/uL   Basophils Relative 0 0 - 1 %   Basophils Absolute 0.0 0.0 - 0.1 K/uL  Ethanol     Status: None   Collection Time: 03/08/15 11:29 AM  Result Value Ref Range   Alcohol, Ethyl (B) <5 0 - 9 mg/dL    Comment:        LOWEST DETECTABLE LIMIT FOR SERUM ALCOHOL IS 11 mg/dL FOR MEDICAL PURPOSES ONLY   I-stat Chem 8, ED     Status: Abnormal   Collection Time: 03/08/15 11:43 AM  Result Value Ref Range   Sodium 138 135 - 145 mmol/L   Potassium 4.2 3.5 - 5.1 mmol/L   Chloride 104 96 - 112 mmol/L   BUN 17 6 - 23 mg/dL   Creatinine, Ser 1.61 0.50 - 1.35 mg/dL   Glucose, Bld 096 (H) 70 - 99 mg/dL   Calcium, Ion 0.45 4.09 - 1.23 mmol/L   TCO2 20 0 - 100 mmol/L   Hemoglobin 17.7 (H) 13.0 - 17.0 g/dL   HCT 81.1 91.4 - 78.2 %  Urine rapid drug screen (hosp performed)     Status: Abnormal   Collection Time: 03/08/15 11:50 AM  Result Value Ref Range   Opiates NONE DETECTED NONE DETECTED   Cocaine POSITIVE (A) NONE DETECTED   Benzodiazepines NONE DETECTED NONE DETECTED   Amphetamines NONE DETECTED NONE DETECTED   Tetrahydrocannabinol POSITIVE (A) NONE DETECTED   Barbiturates NONE DETECTED NONE DETECTED    Comment:        DRUG SCREEN FOR MEDICAL PURPOSES ONLY.  IF CONFIRMATION IS NEEDED FOR ANY PURPOSE, NOTIFY LAB WITHIN 5 DAYS.        LOWEST DETECTABLE LIMITS FOR URINE DRUG SCREEN Drug Class       Cutoff (ng/mL) Amphetamine      1000 Barbiturate      200 Benzodiazepine   200 Tricyclics       300 Opiates          300 Cocaine          300 THC              50     Physical Findings: AIMS: Facial and Oral Movements Muscles of Facial Expression: None, normal Lips and Perioral Area: None, normal Jaw: None, normal Tongue: None, normal,Extremity Movements Upper (arms, wrists, hands, fingers): None, normal Lower (legs, knees, ankles, toes): None, normal, Trunk  Movements Neck, shoulders, hips: None, normal, Overall Severity Severity of abnormal movements (highest score from questions above): None, normal Incapacitation due to abnormal movements: None, normal Patient's awareness of abnormal movements (rate only patient's report): No Awareness, Dental Status Current problems with teeth and/or dentures?: No Does patient usually wear dentures?: No  CIWA:  CIWA-Ar Total: 0 COWS:  COWS Total Score: 2  Treatment Plan Summary: Daily contact with patient to assess and evaluate symptoms and progress in treatment and Medication management, continue Seroquel 100 mg at bedtime and Depakote 250 mg twice a day.  He is taking hydroxyzine for anxiety.  Continue detox protocol and relapse prevention.  Increase collateral information.  Continue psychosocial education regarding relapse prevention in self-care.  Encouraged to participate and verbalize in group milieu therapy.  Patient like to go long-term rehabilitation program upon discharge.  Discussed medication side effects.  We will do Depakote level tomorrow.   Medical Decision Making:  Established Problem,  Stable/Improving (1), Review of Psycho-Social Stressors (1), Review or order clinical lab tests (1), Review and summation of old records (2), Review of Last Therapy Session (1), Review of Medication Regimen & Side Effects (2) and Review of New Medication or Change in Dosage (2)     Clarabell Matsuoka T. 03/10/2015, 10:13 AM

## 2015-03-10 NOTE — Progress Notes (Addendum)
D) Pt has been attending the groups, getting to the first of the groups late due to "feeling sleepy". Affect and mood are appropriate. Pt denies SI and HI. States he is feeling tired related to his new medications. Pt rates his depression at an 8, hopelessness at an 8 and his anxiety at a 5.  A) given support and reassurance along with praise. Encouragement provided.  R) Denies SI and HI.

## 2015-03-10 NOTE — Progress Notes (Signed)
Patient ID: Gabriel Barnes, male   DOB: 09-01-1981, 34 y.o.   MRN: [id number] Adult Psychoeducational Group Note  Date:  03/10/2015 Time: 10:30am  Group Topic/Focus:  Identifying Needs:   The focus of this group is to help patients identify their personal needs that have been historically problematic and identify healthy behaviors to address their needs.  Participation Level:  Active  Participation Quality:  Appropriate  Affect:  Appropriate  Cognitive:  Appropriate  Insight: Improving  Engagement in Group:  Improving  Modes of Intervention:  Activity, Discussion, Education and Support  Additional Comments:  Pt able to identify one goal to accomplish to accomplish today.   Aurora Maskwyman,  Carmack E 03/10/2015, 4:13 PM

## 2015-03-10 NOTE — [facility] Group Notes (Signed)
[facility] Group Notes: (Clinical Social Work)   03/10/2015      Type of Therapy:  Group Therapy   Participation Level:  Did Not Attend despite MHT prompting   Oktober Glazer Grossman-Orr, LCSW 03/10/2015, 4:48 PM     

## 2015-03-10 NOTE — Progress Notes (Signed)
Patient ID: [name], male   DOB: [birth year], 34 y.o.   MRN: [id number] Adult Psychoeducational Group Note  Date:  03/10/2015 Time: 09:30am  Group Topic/Focus:  Personal Choices and Values:   The focus of this group is to help patients assess and explore the importance of values in their lives, how their values affect their decisions, how they express their values and what opposes their expression.  Participation Level:  Did Not Attend  Participation Quality:  n/a  Affect:  n/a  Cognitive: n/a  Insight: n/a  Engagement in Group:  n/a  Modes of Intervention:  Activity, Confrontation, Education and Support  Additional Comments:  Pt did not attend group. Pt in bed asleep.   Aurora Maskwyman, Vera Furniss E 03/10/2015, 11:26 AM

## 2015-03-10 NOTE — Progress Notes (Signed)
Patient did attend the evening speaker AA meeting.  

## 2015-03-11 DIAGNOSIS — F1020 Alcohol dependence, uncomplicated: Secondary | ICD-10-CM

## 2015-03-11 DIAGNOSIS — F1420 Cocaine dependence, uncomplicated: Secondary | ICD-10-CM

## 2015-03-11 DIAGNOSIS — F39 Unspecified mood [affective] disorder: Secondary | ICD-10-CM

## 2015-03-11 LAB — VALPROIC ACID LEVEL: Valproic Acid Lvl: 28.8 ug/mL — ABNORMAL LOW (ref 50.0–100.0)

## 2015-03-11 NOTE — Progress Notes (Signed)
Pt attended spiritual care group on grief and loss facilitated by chaplain Gabriel Barnes. Group opened with brief discuBurnis Barnes and psycho-social ed around grief and loss in relationships and in relation to self - identifying life patterns, circumstances, changes that cause losses. Established group norm of speaking from own life experience. Group goal of establishing open and affirming space for members to share loss and experience with grief, normalize grief experience and provide psycho social education and grief support.  Group drew on narrative and Alderian therapeutic modalities.   Gabriel Barnes was present throughout group.  Prior to group, he asked engaged with facilitator around topic of grief and stated that he has a lot of losses in his life.  He asked if "isolation" was a normal part of grief, stating he feels he isolates himself when he is grieving.  During group, Gabriel Barnes shared about his mother's death when he was 34 years old.  He had been arrested shortly before and she died while he was incarcerated.  He described coping with his grief through substance use / destructive behavior.   Identified with other group members around losses being very present.  Stated that his mother was his primary support and when he is in a place of sorrow / difficulty, he grieves not being able to lean on her.    Also engaged in discussion with other group members around grieving differently than family members who try to support Gabriel Barnes.    Gabriel Barnes, Gabriel Barnes

## 2015-03-11 NOTE — Progress Notes (Signed)
Recreation Therapy Notes  Date: 04.18.2016 Time: 9:30am Location: [address] Group Room   Group Topic: Stress Management  Goal Area(s) Addresses:  Patient will actively participate in stress management techniques presented during session.   Behavioral Response: Did not attend.   Marykay Lexenise L Shemeika Starzyk, LRT/CTRS  Tayron Hunnell L 03/11/2015 3:15 PM

## 2015-03-11 NOTE — Progress Notes (Signed)
D: Pt denies SI/HI/AVH. Pt is pleasant and cooperative. Pt stated he was ok, just focused on L-knee pain from surgery 8 yrs ago.    A: Pt was offered support and encouragement. Pt was given scheduled medications. Pt was encourage to attend groups. Q 15 minute checks were done for safety.   R:Pt attends groups and interacts well with peers and staff. Pt is taking medication. Pt has no complaints at this time .Pt receptive to treatment and safety maintained on unit.

## 2015-03-11 NOTE — Progress Notes (Signed)
[facility] MD Progress Note  03/11/2015 2:06 PM Gabriel Barnes  MRN:  [id number] Subjective:  States he has been increasingly more depressed and feeling suicidal after his wife told him he was not coming back to the house and that she got a 50 B. 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 He admits he has been using and that his wife is fed up with his use. He states he needs help. He has been diagnosed with Bipolar Disorder and has been off his medications for a while. States that moving forward he would like to continue to take his medications for his mood disorder and work on abstaining long term. He states he does not want to lose his family. He would like to go into a residential treatment program Principal Problem: Substance abuse Diagnosis:   Patient Active Problem List   Diagnosis Date Noted  . Substance abuse [F19.10] 03/09/2015  . Bipolar 1 disorder, mixed, moderate [F31.62]   . Depression [F32.9] 03/08/2015   Total Time spent with patient: 30 minutes   Past Medical History:  Past Medical History  Diagnosis Date  . Acid reflux   . Depression   . Anxiety   . Bipolar 1 disorder   . Polysubstance abuse     etoh, marijuana, molly, cocaine    Past Surgical History  Procedure Laterality Date  . Knee arthroplasty    . Ankle surgery    . Knee surgery     Family History: History reviewed. No pertinent family history. Social History:  History  Alcohol Use  . Yes    Comment: daily, bingwe drinks on the weekend     History  Drug Use  . 1.00 per week  . Special: Marijuana, Cocaine    Comment: molly    History   Social History  . Marital Status: Single    Spouse Name: N/A  . Number of Children: N/A  . Years of Education: N/A   Social History Main Topics  . Smoking status: Never Smoker   . Smokeless tobacco: Never Used  . Alcohol Use: Yes     Comment: daily, bingwe drinks on the weekend  . Drug Use: 1.00 per week    Special:  Marijuana, Cocaine     Comment: molly  . Sexual Activity: Not on file   Other Topics Concern  . None   Social History Narrative   Additional History:    Sleep: Poor  Appetite:  Poor   Assessment:   Musculoskeletal: Strength & Muscle Tone: within normal limits Gait & Station: normal Patient leans: N/A   Psychiatric Specialty Exam: Physical Exam  Review of Systems  Constitutional: Positive for malaise/fatigue.  HENT: Negative.   Eyes: Negative.   Respiratory: Negative.   Cardiovascular: Negative.   Gastrointestinal: Negative.   Genitourinary: Negative.   Musculoskeletal: Negative.   Skin: Negative.   Neurological: Negative.   Endo/Heme/Allergies: Negative.   Psychiatric/Behavioral: Positive for depression and substance abuse. The patient is nervous/anxious and has insomnia.     Blood pressure 124/80, pulse 103, temperature 97.6 F (36.4 C), temperature source Oral, resp. rate 16, height  (1.854 m), weight 111.585 kg (246 lb), SpO2 100 %.Body mass index is 32.46 kg/(m^2).  General Appearance: Fairly Groomed  Patent attorney::  Fair  Speech:  Clear and Coherent  Volume:  fluctuates  Mood:  Anxious and Depressed  Affect:  Depressed and anxious worried  Thought Process:  Coherent and Goal  Directed  Orientation:  Full (Time, Place, and Person)  Thought Content:  symptoms events worries concerns  Suicidal Thoughts:  No  Homicidal Thoughts:  No  Memory:  Immediate;   Fair Recent;   Fair Remote;   Fair  Judgement:  Fair  Insight:  Present and Shallow  Psychomotor Activity:  Restlessness  Concentration:  Fair  Recall:  [employer] of Knowledge:Fair  Language: Fair  Akathisia:  No  Handed:  Right  AIMS (if indicated):     Assets:  Desire for Improvement  ADL's:  Intact  Cognition: WNL  Sleep:  Number of Hours: 5.25     Current Medications: Current Facility-Administered Medications  Medication Dose Route Frequency Provider Last Rate Last Dose  .  acetaminophen (TYLENOL) tablet 650 mg  650 mg Oral Q4H PRN Adonis Brook, NP      . alum & mag hydroxide-simeth (MAALOX/MYLANTA) 200-200-20 MG/5ML suspension 30 mL  30 mL Oral PRN Adonis Brook, NP      . divalproex (DEPAKOTE ER) 24 hr tablet 250 mg  250 mg Oral BID Cleotis Nipper, MD   250 mg at 03/11/15 0800  . hydrOXYzine (ATARAX/VISTARIL) tablet 25 mg  25 mg Oral TID PRN Cleotis Nipper, MD      . ibuprofen (ADVIL,MOTRIN) tablet 400 mg  400 mg Oral Q8H PRN Adonis Brook, NP      . LORazepam (ATIVAN) tablet 0-4 mg  0-4 mg Oral Q12H Adonis Brook, NP   1 mg at 03/10/15 2146  . LORazepam (ATIVAN) tablet 1 mg  1 mg Oral Q8H PRN Adonis Brook, NP   1 mg at 03/08/15 2122  . magnesium hydroxide (MILK OF MAGNESIA) suspension 30 mL  30 mL Oral Daily PRN Adonis Brook, NP      . nicotine (NICODERM CQ - dosed in mg/24 hours) patch 21 mg  21 mg Transdermal Daily PRN Adonis Brook, NP      . ondansetron [facility]) tablet 4 mg  4 mg Oral Q8H PRN Adonis Brook, NP      . QUEtiapine (SEROQUEL) tablet 100 mg  100 mg Oral QHS Cleotis Nipper, MD   100 mg at 03/10/15 2146  . thiamine (VITAMIN B-1) tablet 100 mg  100 mg Oral Daily Adonis Brook, NP   100 mg at 03/10/15 1024   Or  . thiamine (B-1) injection 100 mg  100 mg Intravenous Daily Adonis Brook, NP      . traZODone (DESYREL) tablet 50 mg  50 mg Oral QHS PRN Adonis Brook, NP   50 mg at 03/10/15 2145    Lab Results: No results found for this or any previous visit (from the past 48 hour(s)).  Physical Findings: AIMS: Facial and Oral Movements Muscles of Facial Expression: None, normal Lips and Perioral Area: None, normal Jaw: None, normal Tongue: None, normal,Extremity Movements Upper (arms, wrists, hands, fingers): None, normal Lower (legs, knees, ankles, toes): None, normal, Trunk Movements Neck, shoulders, hips: None, normal, Overall Severity Severity of abnormal movements (highest score from questions above): None, normal Incapacitation  due to abnormal movements: None, normal Patient's awareness of abnormal movements (rate only patient's report): No Awareness, Dental Status Current problems with teeth and/or dentures?: No Does patient usually wear dentures?: No  CIWA:  CIWA-Ar Total: 0 COWS:  COWS Total Score: 0  Treatment Plan Summary: Daily contact with patient to assess and evaluate symptoms and progress in treatment and Medication management  Supportive approach/coping skills Alcohol-cocaine dependence continue the detox protocol and  work a relapse prevention plan Mood Disorder: continue the Depakote and the Seroquel and optimize response Help to cope with the stress of having the 50 B placed  on him Use CBT/mindfulness  Will explore residential treatment programs he could go to   Medical Decision Making:  Review of Psycho-Social Stressors (1), Review or order clinical lab tests (1), Review of Medication Regimen & Side Effects (2) and Review of New Medication or Change in Dosage (2)     Eldean Nanna A 03/11/2015, 2:06 PM

## 2015-03-11 NOTE — Progress Notes (Signed)
Pt has been up and active in the milieu. He rated all his depression, hopelessness and anxiety on his self-inventory as an 8 on all three. He did admit to having some passive S/I with know plans here in the hospital and does verbally contract to come to staff before acting on any self-harm thoughts. He is looking for long-term placement from here.

## 2015-03-11 NOTE — Clinical Social Work Note (Signed)
At patient request, CS[facility]. CSW will continue to follow.  Samuella BruinKristin Donevin Sainsbury, MSW, [employer]LCSWA Clinical Social Worker [facility] [phone]

## 2015-03-11 NOTE — [facility] Group Notes (Signed)
   [facility]  03/11/2015  8:45 AM   Participation Quality: Alert, Appropriate and Oriented  Mood/Affect: Depressed and Flat  Depression Rating: 8  Anxiety Rating: 8  Thoughts of Suicide: Pt endorsing SI but contracts for safety  Will you contract for safety? Yes  Current AVH: Pt denies  Plan for Discharge/Comments: Pt attended discharge planning group and actively participated in group. CSW provided pt with today's workbook. Patient requesting residential treatment at discharge.  Transportation Means: Pt reports access to transportation  Supports: No supports mentioned at this time  Samuella BruinKristin Codey Burling, MSW, [employer]LCSWA Clinical Social Worker [facility] [phone]

## 2015-03-11 NOTE — [facility] Group Notes (Signed)
[facility] LCSW Group Therapy 03/11/2015  1:15 pm  Type of Therapy: Group Therapy Participation Level: Active  Participation Quality: Attentive, Sharing and Supportive  Affect: Depressed and Flat  Cognitive: Alert and Oriented  Insight: Developing/Improving and Engaged  Engagement in Therapy: Developing/Improving and Engaged  Modes of Intervention: Clarification, Confrontation, Discussion, Education, Exploration,  Limit-setting, Orientation, Problem-solving, Rapport Building, Dance movement psychotherapisteality Testing, Socialization and Support  Summary of Progress/Problems: Pt identified obstacles faced currently and processed barriers involved in overcoming these obstacles. Pt identified steps necessary for overcoming these obstacles and explored motivation (internal and external) for facing these difficulties head on. Pt further identified one area of concern in their lives and chose a goal to focus on for today. Patient identified medication compliance as his short-term goal. Patient shared that he has been using drugs and alcohol to self medicate but shared the negative consequences of use on his life such as the possible loss of his marriage and housing. CSW and other group members provided patient with emotional support and encouragement.  Samuella BruinKristin Darriona Dehaas, MSW, [employer]LCSWA Clinical Social Worker [facility] [phone]

## 2015-03-11 NOTE — Progress Notes (Signed)
D: Patient seen in day room watching TV and interacting with peers. Patient stated "I really want to stop this but I can't. My wife wants me to get straight before we get back together; I stopped all my medication because of all these stuff I take".  A: Support and encouragement offered. Patient encouraged to continue with the treatment plan and verbalize needs to staff. Every 15 minutes check for safety maintained. Will continue to monitor patient for stability. D: Patient very receptive to nursing interventions.

## 2015-03-12 DIAGNOSIS — G4700 Insomnia, unspecified: Secondary | ICD-10-CM

## 2015-03-12 DIAGNOSIS — F419 Anxiety disorder, unspecified: Secondary | ICD-10-CM

## 2015-03-12 MED ORDER — QUETIAPINE FUMARATE 200 MG PO TABS
200.0000 mg | ORAL_TABLET | Freq: Every day | ORAL | Status: DC
  Administered 2015-03-12 – 2015-03-13 (×2): 200 mg via ORAL
  Filled 2015-03-12 (×3): qty 1
  Filled 2015-03-12 (×2): qty 14

## 2015-03-12 MED ORDER — DIVALPROEX SODIUM ER 500 MG PO TB24
500.0000 mg | ORAL_TABLET | Freq: Two times a day (BID) | ORAL | Status: DC
  Administered 2015-03-12 – 2015-03-14 (×4): 500 mg via ORAL
  Filled 2015-03-12 (×2): qty 28
  Filled 2015-03-12 (×5): qty 1
  Filled 2015-03-12 (×2): qty 28
  Filled 2015-03-12: qty 1

## 2015-03-12 MED ORDER — QUETIAPINE FUMARATE 50 MG PO TABS
50.0000 mg | ORAL_TABLET | Freq: Two times a day (BID) | ORAL | Status: DC
  Administered 2015-03-12 – 2015-03-14 (×4): 50 mg via ORAL
  Filled 2015-03-12: qty 28
  Filled 2015-03-12 (×5): qty 1
  Filled 2015-03-12: qty 28
  Filled 2015-03-12: qty 1
  Filled 2015-03-12 (×2): qty 28

## 2015-03-12 NOTE — Clinical Social Work Note (Signed)
CSW has provided patient with listing [facility] and encouraged him to call.  Samuella BruinKristin Yuktha Kerchner, MSW, [employer]CS[employer] Clinical Social Worker [facility] [phone]

## 2015-03-12 NOTE — Clinical Social Work Note (Addendum)
[facility] expressed interest in [facility] and was provided with telephone number and encouraged to call. Patient came back and informed CSW that he was unable to speak with director at this time. Patient expressed interest in [facility]- CSW contacted [facility] and spoke with staff member who reports that there is room available for a male over the next several days.  Samuella BruinKristin Phung Kotas, MSW, [employer]CSWA Clinical Social Worker [facility] [phone]

## 2015-03-12 NOTE — Progress Notes (Signed)
D: Patient has blunted, irritable affect and depressed mood. Declined completing the self inventory sheet today. Patient did not attend morning groups, but as the day progresses he's now participating in afternoon group sessions. Visible in the milieu and minimal interaction with peers in the dayroom. Adheres to the medication regimen.  A: Support and encouragement provided to patient. Administered medications per ordering MD. Monitor Q15 minute checks for safety.  R: Patient receptive. Denies SI/HI and AVH. Patient remains safe on the unit.

## 2015-03-12 NOTE — Progress Notes (Signed)
[facility]  03/12/2015 12:40 PM Gabriel Barnes Subjective:  Gabriel Barnes endorses he is having a lot of mood swings. States he cant deal with the fact that his wife got a 50 B order against him. States he did not sleep last night thinking about this. Admits to having a lot of anxiety, feeling agitated having racing thoughts. "cant stop thinking" states he is afraid of what could happen if he was out there feeling like this Principal Problem: Substance abuse Diagnosis:   Patient Active Problem List   Diagnosis Date Noted  . Substance abuse [F19.10] 03/09/2015  . Bipolar 1 disorder, mixed, moderate [F31.62]   . Depression [F32.9] 03/08/2015   Total Time spent with patient: 30 minutes   Past Medical History:  Past Medical History  Diagnosis Date  . Acid reflux   . Depression   . Anxiety   . Bipolar 1 disorder   . Polysubstance abuse     etoh, marijuana, molly, cocaine    Past Surgical History  Procedure Laterality Date  . Knee arthroplasty    . Ankle surgery    . Knee surgery     Family History: History reviewed. No pertinent family history. Social History:  History  Alcohol Use  . Yes    Comment: daily, bingwe drinks on the weekend     History  Drug Use  . 1.00 per week  . Special: Marijuana, Cocaine    Comment: molly    History   Social History  . Marital Status: Single    Spouse Name: N/A  . Number of Children: N/A  . Years of Education: N/A   Social History Main Topics  . Smoking status: Never Smoker   . Smokeless tobacco: Never Used  . Alcohol Use: Yes     Comment: daily, bingwe drinks on the weekend  . Drug Use: 1.00 per week    Special: Marijuana, Cocaine     Comment: molly  . Sexual Activity: Not on file   Other Topics Concern  . None   Social History Narrative   Additional History:    Sleep: Poor  Appetite:  Poor   Assessment:   Musculoskeletal: Strength & Muscle Tone: within normal limits Gait & Station:  normal Patient leans: N/A   Psychiatric Specialty Exam: Physical Exam  Review of Systems  Constitutional: Positive for malaise/fatigue.  HENT: Negative.   Eyes: Negative.   Respiratory: Negative.   Cardiovascular: Negative.   Gastrointestinal: Negative.   Musculoskeletal: Negative.   Skin: Negative.   Neurological: Positive for weakness.  Endo/Heme/Allergies: Negative.   Psychiatric/Behavioral: Positive for depression and substance abuse. The patient is nervous/anxious.     Blood pressure 132/85, pulse 95, temperature 97.9 F (36.6 C), temperature source Oral, resp. rate 18, height 6\' 1"  (1.854 m), weight 111.585 kg (246 lb), SpO2 100 %.Body mass index is 32.46 kg/(m^2).  General Appearance: Fairly Groomed  Patent attorneyye Contact::  Fair  Speech:  Pressured  Volume:  Increased  Mood:  Anxious, Depressed, Dysphoric and Irritable  Affect:  Labile and Tearful  Thought Process:  Coherent and Goal Directed  Orientation:  Full (Time, Place, and Person)  Thought Content:  symptoms events worries concerns  Suicidal Thoughts:  on and off, but would not act on them   Homicidal Thoughts:  No  Memory:  Immediate;   Fair Recent;   Fair Remote;   Fair  Judgement:  Fair  Insight:  Present and Shallow  Psychomotor Activity:  Increased and Restlessness  Concentration:  Fair  Recall:  [employer] of Knowledge:Fair  Language: Fair  Akathisia:  No  Handed:  Right  AIMS (if indicated):     Assets:  Desire for Improvement  ADL's:  Intact  Cognition: WNL  Sleep:  Number of Hours: 6.75     Current Medications: Current Facility-Administered Medications  Medication Dose Route Frequency Provider Last Rate Last Dose  . acetaminophen (TYLENOL) tablet 650 mg  650 mg Oral Q4H PRN Adonis Brook, NP      . alum & mag hydroxide-simeth (MAALOX/MYLANTA) 200-200-20 MG/5ML suspension 30 mL  30 mL Oral PRN Adonis Brook, NP      . divalproex (DEPAKOTE ER) 24 hr tablet 250 mg  250 mg Oral BID Cleotis Nipper,  MD   250 mg at 03/12/15 1610  . hydrOXYzine (ATARAX/VISTARIL) tablet 25 mg  25 mg Oral TID PRN Cleotis Nipper, MD   25 mg at 03/11/15 2138  . ibuprofen (ADVIL,MOTRIN) tablet 400 mg  400 mg Oral Q8H PRN Adonis Brook, NP      . LORazepam (ATIVAN) tablet 1 mg  1 mg Oral Q8H PRN Adonis Brook, NP   1 mg at 03/08/15 2122  . magnesium hydroxide (MILK OF MAGNESIA) suspension 30 mL  30 mL Oral Daily PRN Adonis Brook, NP      . nicotine (NICODERM CQ - dosed in mg/24 hours) patch 21 mg  21 mg Transdermal Daily PRN Adonis Brook, NP      . ondansetron [facility]) tablet 4 mg  4 mg Oral Q8H PRN Adonis Brook, NP      . QUEtiapine (SEROQUEL) tablet 100 mg  100 mg Oral QHS Cleotis Nipper, MD   100 mg at 03/11/15 2137  . thiamine (VITAMIN B-1) tablet 100 mg  100 mg Oral Daily Adonis Brook, NP   100 mg at 03/12/15 0831   Or  . thiamine (B-1) injection 100 mg  100 mg Intravenous Daily Adonis Brook, NP      . traZODone (DESYREL) tablet 50 mg  50 mg Oral QHS PRN Adonis Brook, NP   50 mg at 03/11/15 2137    Lab Results:  Results for orders placed or performed during the hospital encounter of 03/08/15 (from the past 48 hour(s))  Valproic acid level     Status: Abnormal   Collection Time: 03/11/15  7:43 PM  Result Value Ref Range   Valproic Acid Lvl 28.8 (L) 50.0 - 100.0 ug/mL    Comment: Performed at [facility]    Physical Findings: AIMS: Facial and Oral Movements Muscles of Facial Expression: None, normal Lips and Perioral Area: None, normal Jaw: None, normal Tongue: None, normal,Extremity Movements Upper (arms, wrists, hands, fingers): None, normal Lower (legs, knees, ankles, toes): None, normal, Trunk Movements Neck, shoulders, hips: None, normal, Overall Severity Severity of abnormal movements (highest score from questions above): None, normal Incapacitation due to abnormal movements: None, normal Patient's awareness of abnormal movements (rate only patient's report): No Awareness,  Dental Status Current problems with teeth and/or dentures?: No Does patient usually wear dentures?: No  CIWA:  CIWA-Ar Total: 0 COWS:  COWS Total Score: 0  Treatment Plan Summary: Daily contact with patient to assess and evaluate symptoms and progress in treatment and Medication management  Supportive approach/coping skills Substance Abuse: will complete the detox and work a relapse prevention plan Mood instability; will increase the Depakote and the Seroquel  Anxiety: will have some Seroquel during the day,will work  with CBT/mindfulness Insomnia; will increase the Seroquel at night Explore the possibility of a residential treatment program  Medical Decision Making:  Review of Psycho-Social Stressors (1), Review or order clinical lab tests (1), Review of Medication Regimen & Side Effects (2) and Review of New Medication or Change in Dosage (2)     Yehoshua Vitelli A 03/12/2015, 12:40 PM

## 2015-03-12 NOTE — Plan of Care (Signed)
Problem: Ineffective individual coping Goal: STG: Patient will remain free from self harm Outcome: Progressing Pt remains safe on the unit at this time      

## 2015-03-12 NOTE — BHH Group Notes (Signed)
Adult Psychoeducational Group Note  Date:  03/12/2015 Time:  10:24 PM  Group Topic/Focus:  Wrap-Up Group:   The focus of this group is to help patients review their daily goal of treatment and discuss progress on daily workbooks.  Participation Level:  Active  Participation Quality:  Appropriate  Affect:  Appropriate  Cognitive:  Appropriate  Insight: Appropriate  Engagement in Group:  Engaged  Modes of Intervention:  Discussion  Additional Comments:  Beulah GandyRudolph stated his day was like a roller coaster.  He expressed that he is think too much and worrying too much.  He said things got better once he took a shower and prayed.  He said his discharge has been extended.  His coping skill is praying a lot.  Caroll RancherLindsay, Osbaldo Mark A 03/12/2015, 10:24 PM

## 2015-03-12 NOTE — Progress Notes (Signed)
Patient endorsed having SI earlier this morning but felt better as the day progresses. He was observed interacting with peers. Mood and affect incongruent. He reported having difficulty sleeping well last night but stated that; "Dr Dub MikesLugo has increased my Seroquel to 250 mg at bedtime and I can also have Trazodone with that". Writer encouraged and supported patient. Q 15 minute check continues as ordered to maintain safety.

## 2015-03-12 NOTE — BHH Group Notes (Signed)
Adult Psychoeducational Group Note  Date:  03/12/2015 Time:  9:00 AM  Group Topic/Focus:  Overcoming Stress:   The focus of this group is to define stress and help patients assess their triggers.  Participation Level:  Did Not Attend  Additional Comments: Pt. resting in bed during group.  Harold BarbanByrd, Ronecia E 03/12/2015, 5:29 PM

## 2015-03-12 NOTE — BHH Group Notes (Signed)
BHH LCSW Group Therapy  03/12/2015   1:15 PM   Type of Therapy:  Group Therapy  Participation Level:  Active  Participation Quality:  Attentive, Sharing and Supportive  Affect:  Appropriate  Cognitive:  Alert and Oriented  Insight:  Developing/Improving and Engaged  Engagement in Therapy:  Developing/Improving and Engaged  Modes of Intervention:  Clarification, Confrontation, Discussion, Education, Exploration, Limit-setting, Orientation, Problem-solving, Rapport Building, Dance movement psychotherapisteality Testing, Socialization and Support  Summary of Progress/Problems: The topic for group therapy was feelings about diagnosis.  Pt actively participated in group discussion on their past and current diagnosis and how they feel towards this.  Pt also identified how society and family members judge them, based on their diagnosis as well as stereotypes and stigmas.  Patient discussed feeling unsupported by his wife and difficulty trusting others. CSW and other group members provided emotional support and encouragement.  Samuella BruinKristin Anhad Sheeley, MSW, [employer]CS[employer] Clinical Social Worker [facility] [phone]

## 2015-03-12 NOTE — Tx Team (Signed)
Interdisciplinary Treatment Plan Update (Adult) Date: 03/12/2015   Time Reviewed: 9:30 AM  Progress in Treatment: Attending groups: Yes Participating in groups: Yes Taking medication as prescribed: Yes Tolerating medication: Yes Family/Significant other contact made: No, CSW assessing for appropriate contacts Patient understands diagnosis: Yes Discussing patient identified problems/goals with staff: Yes Medical problems stabilized or resolved: Yes Denies suicidal/homicidal ideation: Yes Issues/concerns per patient self-inventory: Yes Other:  New problem(s) identified: N/A  Discharge Plan or Barriers:   4/19:  Patient requesting residential treatment at this time. CSW has made [facility] and [facility] referrals and also provided patient with [facility]. Patient reports that he cannot return home at this time without treatment.   Reason for Continuation of Hospitalization:  Depression Anxiety Medication Stabilization   Comments: N/A  Estimated length of stay: 1-2 days  For review of initial/current patient goals, please see plan of care.  Patient is a 34  year old Male admitted for SI and substance abuse. [place] and children. Patient will benefit from crisis stabilization, medication evaluation, group therapy, and psycho education in addition to case management for discharge planning. Patient and CSW reviewed pt's identified goals and treatment plan. Pt verbalized understanding and agreed to treatment plan.    Attendees: Patient:    Family:    Physician: Dr. Jama Flavorsobos; Dr. Dub MikesLugo 03/12/2015 9:30 AM  Nursing: Joslyn Devonaroline Beaudry, Merian CapronMarian Friedman, Harold Barbanonecia Byrd, [place]any Guthrie, [place] 03/12/2015 9:30 AM  Clinical Social Worker: Samuella BruinKristin Shaquavia Whisonant,  LCSWA 03/12/2015 9:30 AM  Other: Juline PatchQuylle Hodnett, LCSW 03/12/2015 9:30 AM  Other: Trula SladeHeather Smart, LCSWA 03/12/2015 9:30 AM  Other: Onnie BoerJennifer Clark, Case Manager 03/12/2015 9:30 AM  Other: Mosetta AnisAggie Nwoko, Laura  Davis NP 03/12/2015 9:30 AM  Other:    Other:    Other:       Scribe for Treatment Team:  Samuella BruinKristin Giomar Gusler, MSW, [employer]CSWA [phone]

## 2015-03-13 NOTE — Clinical Social Work Note (Signed)
CSW left voicemail for wife Dolores Loryratricia Gillian [phone] to provide SPE. Awaiting return call.  Samuella BruinKristin Rondall Radigan, MSW, [employer]CS[employer] Clinical Social Worker [facility] [phone]

## 2015-03-13 NOTE — [facility] Group Notes (Signed)
   [facility]  03/13/2015  8:45 AM   Participation Quality: Alert, Appropriate and Oriented  Mood/Affect: Depressed and Flat  Depression Rating: 10  Anxiety Rating: 8  Thoughts of Suicide: Pt endorses passive SI but contracts for safety  Will you contract for safety? Yes  Current AVH: Pt denies  Plan for Discharge/Comments: Pt attended discharge planning group and actively participated in group. CSW provided pt with today's workbook. Patient lethargic with flat and depressed affect today. Patient endorsing high levels of symptoms. Patient continues to be homeless and cannot return home due to a restraining order from wife. [facility] referral pending. [facility] if he is unable to get into [facility] at time of discharge.   Transportation Means: Pt reports access to transportation- patient reports that his car is at the hospital  Supports: No supports mentioned at this time  Gabriel Barnes, MSW, [employer]CS[employer] Clinical Social Worker [facility] [phone]

## 2015-03-13 NOTE — Progress Notes (Signed)
[facility] MD Progress Note  03/13/2015 2:08 PM EJ PINSON  MRN:  [id number] Subjective:  Upset with the situation he finds himself in, his wife having pursued a 50 B. He states he wants to do the right things and keep addressing his substance abuse so there could be some hope of getting back together. States his wife, the kids are his priorities. .  Principal Problem: Substance abuse Diagnosis:   Patient Active Problem List   Diagnosis Date Noted  . Substance abuse [F19.10] 03/09/2015  . Bipolar 1 disorder, mixed, moderate [F31.62]   . Depression [F32.9] 03/08/2015   Total Time spent with patient: 30 minutes   Past Medical History:  Past Medical History  Diagnosis Date  . Acid reflux   . Depression   . Anxiety   . Bipolar 1 disorder   . Polysubstance abuse     etoh, marijuana, molly, cocaine    Past Surgical History  Procedure Laterality Date  . Knee arthroplasty    . Ankle surgery    . Knee surgery     Family History: History reviewed. No pertinent family history. Social History:  History  Alcohol Use  . Yes    Comment: daily, bingwe drinks on the weekend     History  Drug Use  . 1.00 per week  . Special: Marijuana, Cocaine    Comment: molly    History   Social History  . Marital Status: Single    Spouse Name: N/A  . Number of Children: N/A  . Years of Education: N/A   Social History Main Topics  . Smoking status: Never Smoker   . Smokeless tobacco: Never Used  . Alcohol Use: Yes     Comment: daily, bingwe drinks on the weekend  . Drug Use: 1.00 per week    Special: Marijuana, Cocaine     Comment: molly  . Sexual Activity: Not on file   Other Topics Concern  . None   Social History Narrative   Additional History:    Sleep: Fair  Appetite:  Fair   Assessment:   Musculoskeletal: Strength & Muscle Tone: within normal limits Gait & Station: normal Patient leans: N/A   Psychiatric Specialty Exam: Physical Exam  Review of Systems   Constitutional: Negative.   HENT: Negative.   Eyes: Negative.   Respiratory: Negative.   Cardiovascular: Negative.   Gastrointestinal: Negative.   Genitourinary: Negative.   Musculoskeletal: Negative.   Skin: Negative.   Neurological: Negative.   Endo/Heme/Allergies: Negative.   Psychiatric/Behavioral: Positive for depression and substance abuse. The patient is nervous/anxious.     Blood pressure 116/87, pulse 102, temperature 97.9 F (36.6 C), temperature source Oral, resp. rate 16, height  (1.854 m), weight 111.585 kg (246 lb), SpO2 100 %.Body mass index is 32.46 kg/(m^2).  General Appearance: Fairly Groomed  Patent attorney::  Fair  Speech:  Clear and Coherent  Volume:  Decreased  Mood:  Anxious and worried  Affect:  anxious worried  Thought Process:  Coherent and Goal Directed  Orientation:  Full (Time, Place, and Person)  Thought Content:  symptoms events worries concerns  Suicidal Thoughts:  No  Homicidal Thoughts:  No  Memory:  Immediate;   Fair Recent;   Fair Remote;   Fair  Judgement:  Fair  Insight:  Present and Shallow  Psychomotor Activity:  Restlessness  Concentration:  Fair  Recall:  [employer] of Knowledge:Fair  Language: Fair  Akathisia:  No  Handed:  Right  AIMS (  if indicated):     Assets:  Desire for Improvement  ADL's:  Intact  Cognition: WNL  Sleep:  Number of Hours: 6.25     Current Medications: Current Facility-Administered Medications  Medication Dose Route Frequency Provider Last Rate Last Dose  . acetaminophen (TYLENOL) tablet 650 mg  650 mg Oral Q4H PRN Adonis BrookSheila Agustin, NP      . alum & mag hydroxide-simeth (MAALOX/MYLANTA) 200-200-20 MG/5ML suspension 30 mL  30 mL Oral PRN Adonis BrookSheila Agustin, NP      . divalproex (DEPAKOTE ER) 24 hr tablet 500 mg  500 mg Oral BID Rachael FeeIrving A Bonnita Newby, MD   500 mg at 03/13/15 0746  . hydrOXYzine (ATARAX/VISTARIL) tablet 25 mg  25 mg Oral TID PRN Cleotis NipperSyed T Arfeen, MD   25 mg at 03/12/15 2122  . ibuprofen  (ADVIL,MOTRIN) tablet 400 mg  400 mg Oral Q8H PRN Adonis BrookSheila Agustin, NP   400 mg at 03/12/15 2128  . LORazepam (ATIVAN) tablet 1 mg  1 mg Oral Q8H PRN Adonis BrookSheila Agustin, NP   1 mg at 03/13/15 0044  . magnesium hydroxide (MILK OF MAGNESIA) suspension 30 mL  30 mL Oral Daily PRN Adonis BrookSheila Agustin, NP      . nicotine (NICODERM CQ - dosed in mg/24 hours) patch 21 mg  21 mg Transdermal Daily PRN Adonis BrookSheila Agustin, NP      . ondansetron [facility](ZOFRAN) tablet 4 mg  4 mg Oral Q8H PRN Adonis BrookSheila Agustin, NP      . QUEtiapine (SEROQUEL) tablet 200 mg  200 mg Oral QHS Rachael FeeIrving A Allyne Hebert, MD   200 mg at 03/12/15 2121  . QUEtiapine (SEROQUEL) tablet 50 mg  50 mg Oral BID Rachael FeeIrving A Peyson Delao, MD   50 mg at 03/13/15 0746  . thiamine (VITAMIN B-1) tablet 100 mg  100 mg Oral Daily Adonis BrookSheila Agustin, NP   100 mg at 03/13/15 [id number]   Or  . thiamine (B-1) injection 100 mg  100 mg Intravenous Daily Adonis BrookSheila Agustin, NP      . traZODone (DESYREL) tablet 50 mg  50 mg Oral QHS PRN Adonis BrookSheila Agustin, NP   50 mg at 03/11/15 2137    Lab Results:  Results for orders placed or performed during the hospital encounter of 03/08/15 (from the past 48 hour(s))  Valproic acid level     Status: Abnormal   Collection Time: 03/11/15  7:43 PM  Result Value Ref Range   Valproic Acid Lvl 28.8 (L) 50.0 - 100.0 ug/mL    Comment: Performed at [facility]    Physical Findings: AIMS: Facial and Oral Movements Muscles of Facial Expression: None, normal Lips and Perioral Area: None, normal Jaw: None, normal Tongue: None, normal,Extremity Movements Upper (arms, wrists, hands, fingers): None, normal Lower (legs, knees, ankles, toes): None, normal, Trunk Movements Neck, shoulders, hips: None, normal, Overall Severity Severity of abnormal movements (highest score from questions above): None, normal Incapacitation due to abnormal movements: None, normal Patient's awareness of abnormal movements (rate only patient's report): No Awareness, Dental Status Current problems  with teeth and/or dentures?: No Does patient usually wear dentures?: No  CIWA:  CIWA-Ar Total: 0 COWS:  COWS Total Score: 0  Treatment Plan Summary: Daily contact with patient to assess and evaluate symptoms and progress in treatment and Medication management Supportive approach/coping skills Polysubstance dependence; complete the detox/work a relapse prevention plan Mood Instability: Pursue the Depakote and the Seroquel, optimize response. Assess for side effects Explore residential treatment options: will [facility] CBT/mindfulness Medical Decision Making:  Review of Psycho-Social Stressors (1), Review or order clinical lab tests (1), Review of Medication Regimen & Side Effects (2) and Review of New Medication or Change in Dosage (2)     Mindel Friscia A 03/13/2015, 2:08 PM

## 2015-03-13 NOTE — Progress Notes (Signed)
Pt attended NA group and attentively participated.

## 2015-03-13 NOTE — Progress Notes (Signed)
D: Patient continues to present with blunted, irritable affect and mood. He reported on the self inventory sheet that his sleep and ability to concentrate are poor, fair appetite and low energy level. Patient rates depression and feelings of hopelessness greater than "10" and anxiety "8". He's attending groups today and interactive with peers in the dayroom. In compliance with medications and tolerating them well.   A: Support and encouragement provided to patient. Scheduled medications given per MD orders. Maintain Q15 minute checks for safety.  R: Patient receptive. Denies SI/HI and AVH. Patient remains safe on the hall.

## 2015-03-13 NOTE — Progress Notes (Addendum)
Patient on the phone at the beginning of the shift. He appeared very angry after getting off the phone and was very agitated. He was using lot of course word and unwilling to participate in the assessment. "What do you want from me, I told her to me bring me that sh----t, she's about to become my ex-wife when I get back home". He said he told his wife to bring him razor because he can not use the one we have on the unit, but the wife refused. Patient kept talking and aggravating himself.He seemed to have rapid cycling mood swings. Writer told patient not to have what someone do or not do affect his emotions negatively; he have not control over what other do but has control over how he reacts to it and not to allow what others do brings the worse out of him. Patient somewhat receptive to the that statement. He came to the medication window to apologize about his behavior to the Clinical research associatewriter.

## 2015-03-13 NOTE — B[facility] Suicide Risk Assessment (Signed)
B[facility] INPATIENT:  Family/Significant Other Suicide Prevention Education  Suicide Prevention Education:  Education Completed; wife Dolores Loryratricia Demeritt [phone] ,  (name of family member/significant other) has been identified by the patient as the family member/significant other with whom the patient will be residing, and identified as the person(s) who will aid the patient in the event of a mental health crisis (suicidal ideations/suicide attempt).  With written consent from the patient, the family member/significant other has been provided the following suicide prevention education, prior to the and/or following the discharge of the patient.  The suicide prevention education provided includes the following:  Suicide risk factors  Suicide prevention and interventions  National Suicide Hotline telephone number  [facility] assessment telephone number  [facility] Emergency Assistance 911  [facility] and/or Residential Mobile Crisis Unit telephone number  Request made of family/significant other to:  Remove weapons (e.g., guns, rifles, knives), all items previously/currently identified as safety concern.    Remove drugs/medications (over-the-counter, prescriptions, illicit drugs), all items previously/currently identified as a safety concern.  The family member/significant other verbalizes understanding of the suicide prevention education information provided.  The family member/significant other agrees to remove the items of safety concern listed above.  Keyera Hattabaugh, West CarboKristin L 03/13/2015, 11:51 AM

## 2015-03-13 NOTE — Clinical Social Work Note (Signed)
[facility], reports that he wants a longer term program. Patient will [facility] to follow up with outpatient services. He reports that he will get gas money from friends and family.  Samuella BruinKristin Kira Hartl, MSW, [employer]CSWA Clinical Social Worker [facility] [phone]

## 2015-03-13 NOTE — Clinical Social Work Note (Signed)
CSW left voicemail for Melissa at [facility] regarding referral status. Awaiting return call.  Myrle Dues, MSW, LCSWA Clinical Social Worker [facility] [phone]  

## 2015-03-13 NOTE — [facility] Group Notes (Signed)
B[facility] LCSW Group Therapy 03/13/2015  1:15 PM Type of Therapy: Group Therapy Participation Level: Active  Participation Quality: Attentive, Sharing and Supportive  Affect: Appropriate  Cognitive: Alert and Oriented  Insight: Developing/Improving and Engaged  Engagement in Therapy: Developing/Improving and Engaged  Modes of Intervention: Clarification, Confrontation, Discussion, Education, Exploration, Limit-setting, Orientation, Problem-solving, Rapport Building, Dance movement psychotherapisteality Testing, Socialization and Support  Summary of Progress/Problems: The topic for group today was emotional regulation. This group focused on both positive and negative emotion identification and allowed group members to process ways to identify feelings, regulate negative emotions, and find healthy ways to manage internal/external emotions. Group members were asked to reflect on a time when their reaction to an emotion led to a negative outcome and explored how alternative responses using emotion regulation would have benefited them. Group members were also asked to discuss a time when emotion regulation was utilized when a negative emotion was experienced. Patient actively engaged in discussion regarding healthy and unhealthy coping skills to deal with emotions. Patient shared that he uses to numb his emotions. CSW and other group members provided patient with emotional support and explored alternative ways to manage feelings. Patient expressed interest in journaling.  Gabriel BruinKristin Kemberly Barnes, MSW, [employer]CSWA Clinical Social Worker [facility] [phone]

## 2015-03-14 ENCOUNTER — Encounter (HOSPITAL_COMMUNITY): Payer: Self-pay | Admitting: Registered Nurse

## 2015-03-14 MED ORDER — QUETIAPINE FUMARATE 50 MG PO TABS: 50.0000 mg | ORAL_TABLET | Freq: Two times a day (BID) | ORAL | Status: DC

## 2015-03-14 MED ORDER — QUETIAPINE FUMARATE 200 MG PO TABS: 200.0000 mg | ORAL_TABLET | Freq: Every day | ORAL | Status: DC

## 2015-03-14 MED ORDER — DIVALPROEX SODIUM ER 500 MG PO TB24: 500.0000 mg | ORAL_TABLET | Freq: Two times a day (BID) | ORAL | Status: DC

## 2015-03-14 MED ORDER — TRAZODONE HCL 50 MG PO TABS: 50.0000 mg | ORAL_TABLET | Freq: Every evening | ORAL | Status: DC | PRN

## 2015-03-14 NOTE — Progress Notes (Signed)
  [facility] :  Will you be returning to the same living situation after discharge:  No. Patient [facility] At discharge, do you have transportation home?: Yes,  patient provided with bus pass and amtrak ticket Do you have the ability to pay for your medications: Yes,  patient will be provided with medication samples and prescriptions at discharge  Release of information consent forms completed and in the chart;  Patient's signature needed at discharge.  Patient to Follow up at: Follow-up Information    Follow up with [facility] On 03/15/2015.   Why:  Walk-in clinic for assessment for therapy and medication management services on Friday April 22nd betwee 9 am to 4 pm. Please bring your ID and proof that you are staying at shelter.  Please call if you need to reschedule.    Contact information:   [address] [place] [phone] Fax [phone]      Patient denies SI/HI: Yes,  denies    Safety Planning and Suicide Prevention discussed: Yes,  with patient and wife  Have you used any form of tobacco in the last 30 days? (Cigarettes, Smokeless Tobacco, Cigars, and/or Pipes): No  Has patient been referred to the Quitline?: N/A patient is not a smoker  Monicka Cyran, Belenda CruiseKristin L 03/14/2015, 12:02 PM

## 2015-03-14 NOTE — Discharge Summary (Signed)
Physician Discharge Summary Note  Patient:  Gabriel Barnes is an 34 y.o., male MRN:  [id number] DOB:  [birth year] Patient phone:  [phone] (home)  Patient address:   [address] [place] [address],  Total Time spent with patient: Greater than 30 minutes  Date of Admission:  03/08/2015 Date of Discharge: 03/14/2015  Reason for Admission:  Per H&P admission: Patient is 34 year old African-American, married, employed man who [facility] due to severe depression and having thoughts to kill himself by walking into traffic. He is using drugs and alcohol. He admitted for past few months he's been not taking his medication and spending money buying crack cocaine and alcohol. He married in August and now his wife is given him an ultimatum that if he do not get better she would leave. Patient is using marijuana, Molly's and binging on cocaine and alcohol. He claimed himself of weekend warrior. His wife is supportive. He admitted some time paranoia, hallucination, anger, severe mood swing and irritability. He has diagnosed with bipolar disorder in the past and he was seeing [facility] however he has not taken his medication in 3 months. He admitted poor sleep, paranoia, feeling people talking about him. He admitted feeling hopeless helpless and worthless. He wants to restart his medication. Patient has at least 2 psychiatric hospitalization in the past when he wasn't elevated and also in [place]. He endorse taking overdose on the drugs and also tried to walk into the traffic in [place]. Patient work as a Regulatory affairs officer. He has 3 kids who lives out of state. He is living with his wife who has 4 kids. His UDS is positive for cocaine   Principal Problem: Substance abuse Discharge Diagnoses: Patient Active Problem List   Diagnosis Date Noted  . Substance abuse [F19.10] 03/09/2015  . Bipolar 1 disorder, mixed, moderate [F31.62]   . Depression [F32.9]  03/08/2015    Musculoskeletal: Strength & Muscle Tone: within normal limits Gait & Station: normal Patient leans: N/A  Psychiatric Specialty Exam:  See Suicide Risk Assessment Physical Exam  Constitutional: He is oriented to person, place, and time.  Neck: Normal range of motion.  Respiratory: Effort normal.  Musculoskeletal: Normal range of motion.  Neurological: He is alert and oriented to person, place, and time.    Review of Systems  Psychiatric/Behavioral: Positive for substance abuse. Negative for suicidal ideas, hallucinations and memory loss. Depression: Stable. Nervous/anxious: Stable. Insomnia: Stable.   All other systems reviewed and are negative.   Blood pressure 122/69, pulse 102, temperature 98 F (36.7 C), temperature source Oral, resp. rate 16, height  (1.854 m), weight 111.585 kg (246 lb), SpO2 100 %.Body mass index is 32.46 kg/(m^2).    Past Medical History:  Past Medical History  Diagnosis Date  . Acid reflux   . Depression   . Anxiety   . Bipolar 1 disorder   . Polysubstance abuse     etoh, marijuana, molly, cocaine    Past Surgical History  Procedure Laterality Date  . Knee arthroplasty    . Ankle surgery    . Knee surgery     Family History: History reviewed. No pertinent family history. Social History:  History  Alcohol Use  . Yes    Comment: daily, bingwe drinks on the weekend     History  Drug Use  . 1.00 per week  . Special: Marijuana, Cocaine    Comment: molly    History   Social History  . Marital  Status: Single    Spouse Name: N/A  . Number of Children: N/A  . Years of Education: N/A   Social History Main Topics  . Smoking status: Never Smoker   . Smokeless tobacco: Never Used  . Alcohol Use: Yes     Comment: daily, bingwe drinks on the weekend  . Drug Use: 1.00 per week    Special: Marijuana, Cocaine     Comment: molly  . Sexual Activity: Not on file   Other Topics Concern  . None   Social History Narrative      Risk to Self: Is patient at risk for suicide?: No What has been your use of drugs/alcohol within the last 12 months?: Dail use of alcohol (2 24 oz beers), Mollys ( 1 gram) and THC (2 Blunts) plus additional use of liguor (fifth or more) and cocaine (1-2 grams) on weekends Risk to Others:   Prior Inpatient Therapy:   Prior Outpatient Therapy:    Level of Care:  OP  Hospital Course:  KHAZA BLANSETT was admitted for Substance abuse and crisis management.  He was treated discharged with the medications listed below under Medication List.  Medical problems were identified and treated as needed.  Home medications were restarted as appropriate.  Improvement was monitored by observation and [name] daily report of symptom reduction.  Emotional and mental status was monitored by daily self-inventory reports completed by [name] and clinical staff.         [name] was evaluated by the treatment team for stability and plans for continued recovery upon discharge.  [name] motivation was an integral factor for scheduling further treatment.  Employment, transportation, bed availability, health status, family support, and any pending legal issues were also considered during his hospital stay.  He was offered further treatment options upon discharge including but not limited to Residential, Intensive Outpatient, and Outpatient treatment.  [name] will follow up with the services as listed below under Follow Up Information.     Upon completion of this admission the patient was both mentally and medically stable for discharge denying suicidal/homicidal ideation, auditory/visual/tactile hallucinations, delusional thoughts and paranoia.      Consults:  psychiatry  Significant Diagnostic Studies:  labs: UDS, ETOH, CBC/Diff, CMET, Urinalysis  Discharge Vitals:   Blood pressure 122/69, pulse 102, temperature 98 F (36.7 C), temperature source Oral, resp.  rate 16, height  (1.854 m), weight 111.585 kg (246 lb), SpO2 100 %. Body mass index is 32.46 kg/(m^2). Lab Results:   Results for orders placed or performed during the hospital encounter of 03/08/15 (from the past 72 hour(s))  Valproic acid level     Status: Abnormal   Collection Time: 03/11/15  7:43 PM  Result Value Ref Range   Valproic Acid Lvl 28.8 (L) 50.0 - 100.0 ug/mL    Comment: Performed at [facility]    Physical Findings: AIMS: Facial and Oral Movements Muscles of Facial Expression: None, normal Lips and Perioral Area: None, normal Jaw: None, normal Tongue: None, normal,Extremity Movements Upper (arms, wrists, hands, fingers): None, normal Lower (legs, knees, ankles, toes): None, normal, Trunk Movements Neck, shoulders, hips: None, normal, Overall Severity Severity of abnormal movements (highest score from questions above): None, normal Incapacitation due to abnormal movements: None, normal Patient's awareness of abnormal movements (rate only patient's report): No Awareness, Dental Status Current problems with teeth and/or dentures?: No Does patient usually wear dentures?: No  CIWA:  CIWA-Ar Total:  0 COWS:  COWS Total Score: 0   See Psychiatric Specialty Exam and Suicide Risk Assessment completed by Attending Physician prior to discharge.  Discharge destination:  Home  Is patient on multiple antipsychotic therapies at discharge:  No   Has Patient had three or more failed trials of antipsychotic monotherapy by history:  No    Recommended Plan for Multiple Antipsychotic Therapies: NA  Discharge Instructions    Activity as tolerated - No restrictions    Complete by:  As directed      Diet general    Complete by:  As directed      Discharge instructions    Complete by:  As directed   Take all of you medications as prescribed by your mental healthcare provider.  Report any adverse effects and reactions from your medications to your outpatient provider  promptly. Do not engage in alcohol and or illegal drug use while on prescription medicines. In the event of worsening symptoms call the crisis hotline, 911, and or go to the nearest emergency department for appropriate evaluation and treatment of symptoms. Follow-up with your primary care provider for your medical issues, concerns and or health care needs.   Keep all scheduled appointments.  If you are unable to keep an appointment call to reschedule.  Let the nurse know if you will need medications before next scheduled appointment.            Medication List    TAKE these medications      Indication   divalproex 500 MG 24 hr tablet  Commonly known as:  DEPAKOTE ER  Take 1 tablet (500 mg total) by mouth 2 (two) times daily. For mood stabilization   Indication:  Rapidly Alternating Manic-Depressive Psychosis     hydrOXYzine 25 MG tablet  Commonly known as:  ATARAX/VISTARIL  Take 25 mg by mouth 3 (three) times daily as needed for anxiety.      QUEtiapine 50 MG tablet  Commonly known as:  SEROQUEL  Take 1 tablet (50 mg total) by mouth 2 (two) times daily. For mood control/depression   Indication:  Depressive Phase of Manic-Depression     QUEtiapine 200 MG tablet  Commonly known as:  SEROQUEL  Take 1 tablet (200 mg total) by mouth at bedtime. For mood contol   Indication:  mood control     traZODone 50 MG tablet  Commonly known as:  DESYREL  Take 1 tablet (50 mg total) by mouth at bedtime as needed for sleep.   Indication:  Trouble Sleeping           Follow-up Information    Follow up with [facility] On 03/15/2015.   Why:  Walk-in clinic for assessment for therapy and medication management services on Friday April 22nd betwee 9 am to 4 pm. Please bring your ID and proof that you are staying at shelter.  Please call if you need to reschedule.    Contact information:   [address] [place] [phone] Fax [phone]      Follow-up  recommendations:  Activity:  As tolerated Diet:  As tolerated  Comments:   Patient has been instructed to take medications as prescribed; and report adverse effects to outpatient provider.  Follow up with primary doctor for any medical issues and If symptoms recur report to nearest emergency or crisis hot line.    Total Discharge Time: Greater than 30 minutes  Signed: Assunta FoundRankin, Shuvon, FNP-BC 03/14/2015, 9:49 AM  I personally assessed the  patient and formulated the plan Randal Goens A. Sriya Kroeze, M.D. 

## 2015-03-14 NOTE — BHH Suicide Risk Assessment (Signed)
[facility] Discharge Suicide Risk Assessment   Demographic Factors:  Male  Total Time spent with patient: 30 minutes  Musculoskeletal: Strength & Muscle Tone: within normal limits Gait & Station: normal Patient leans: N/A  Psychiatric Specialty Exam: Physical Exam  Review of Systems  Constitutional: Negative.   HENT: Negative.   Eyes: Negative.   Respiratory: Negative.   Cardiovascular: Negative.   Gastrointestinal: Negative.   Genitourinary: Negative.   Musculoskeletal: Negative.   Skin: Negative.   Neurological: Negative.   Endo/Heme/Allergies: Negative.   Psychiatric/Behavioral: Positive for substance abuse. The patient is nervous/anxious.     Blood pressure 122/69, pulse 102, temperature 98 F (36.7 C), temperature source Oral, resp. rate 16, height  (1.854 m), weight 111.585 kg (246 lb), SpO2 100 %.Body mass index is 32.46 kg/(m^2).  General Appearance: Fairly Groomed  Patent attorney::  Fair  Speech:  Clear and Coherent409  Volume:  Normal  Mood:  Anxious and worried  Affect:  anxious  Thought Process:  Coherent and Goal Directed  Orientation:  Full (Time, Place, and Person)  Thought Content:  plans as he moves on, relapse prevention plan, frustration because of his wife lack of cooperation in helping him   Suicidal Thoughts:  No  Homicidal Thoughts:  No  Memory:  Immediate;   Fair Recent;   Fair Remote;   Fair  Judgement:  Fair  Insight:  Present and Shallow  Psychomotor Activity:  Restlessness  Concentration:  Fair  Recall:  [employer] of Knowledge:Fair  Language: Fair  Akathisia:  No  Handed:  Right  AIMS (if indicated):     Assets:  Desire for Improvement  Sleep:  Number of Hours: 6.75  Cognition: WNL  ADL's:  Intact   Have you used any form of tobacco in the last 30 days? (Cigarettes, Smokeless Tobacco, Cigars, and/or Pipes): No  Has this patient used any form of tobacco in the last 30 days? (Cigarettes, Smokeless Tobacco, Cigars, and/or Pipes)  No  Mental Status Per Nursing Assessment::   On Admission:  Suicidal ideation indicated by patient, Suicide plan (denies presently)  Current Mental Status by Physician: In full contact with reality. There are no active S/S of withdrawal. There are no active SI plans or intent. He is planning to go to the [employer]. A bed was offered to get into [facility] but he declined citing that his wife will not go for that, that he needs to go to a longer term program. He states he is committed to abstinence. Expressed frustration that his wife is not being more helpful in helping him get to [facility]   Loss Factors: Legal issues  Historical Factors: NA  Risk Reduction Factors:   Sense of responsibility to family  Continued Clinical Symptoms:  Bipolar Disorder:   Depressive phase Alcohol/Substance Abuse/Dependencies  Cognitive Features That Contribute To Risk:  Closed-mindedness, Polarized thinking and Thought constriction (tunnel vision)    Suicide Risk:  Minimal: No identifiable suicidal ideation.  Patients presenting with no risk factors but with morbid ruminations; may be classified as minimal risk based on the severity of the depressive symptoms  Principal Problem: Substance abuse Discharge Diagnoses:  Patient Active Problem List   Diagnosis Date Noted  . Substance abuse [F19.10] 03/09/2015  . Bipolar 1 disorder, mixed, moderate [F31.62]   . Depression [F32.9] 03/08/2015    Follow-up Information    Follow up with [facility] On 03/15/2015.   Why:  Walk-in clinic for assessment for therapy  and medication management services on Friday April 22nd betwee 9 am to 4 pm. Please bring your ID and proof that you are staying at shelter.  Please call if you need to reschedule.    Contact information:   [address] [place] [phone] Fax [phone]      Plan Of Care/Follow-up recommendations:  Activity:  as tolerated Diet:  regular Follow up  [facility] as above Is patient on multiple antipsychotic therapies at discharge:  No   Has Patient had three or more failed trials of antipsychotic monotherapy by history:  No  Recommended Plan for Multiple Antipsychotic Therapies: NA    Derrick Tiegs A 03/14/2015, 12:01 PM

## 2015-03-14 NOTE — BHH Group Notes (Signed)
BHH LCSW Group Therapy 03/14/2015 1:15 PM Type of Therapy: Group Therapy Participation Level: Active  Participation Quality: Attentive, Sharing and Supportive  Affect: Appropriate  Cognitive: Alert and Oriented  Insight: Developing/Improving and Engaged  Engagement in Therapy: Developing/Improving and Engaged  Modes of Intervention: Activity, Clarification, Confrontation, Discussion, Education, Exploration, Limit-setting, Orientation, Problem-solving, Rapport Building, Dance movement psychotherapisteality Testing, Socialization and Support  Summary of Progress/Problems: Patient was attentive and engaged with speaker from [employer]. Patient was attentive to speaker while they shared their story of dealing with mental health and overcoming it. Patient expressed interest in their programs and services and received information on their agency. Patient processed ways they can relate to the speaker.   Samuella BruinKristin Devan Babino, MSW, [employer]CSWA Clinical Social Worker [facility] [phone]

## 2015-03-14 NOTE — Progress Notes (Signed)
Discharge Note: Discharge instructions/prescriptions/medication samples/bus fare and train ticket given to patient. Patient verbalized understanding of discharge instructions and prescriptions. Returned belongings to patient. Denies SI/HI/AVH. Patient d/c without incident to the lobby and [facility] [place] [facility] his [facility].

## 2015-03-19 NOTE — Progress Notes (Signed)
Patient Discharge Instructions:  After Visit Summary (AVS):   Faxed to:  03/19/15 Discharge Summary Note:   Faxed to:  03/19/15 Psychiatric Admission Assessment Note:   Faxed to:  03/19/15 Suicide Risk Assessment - Discharge Assessment:   Faxed to:  03/19/15 Faxed/Sent to the Next Level Care provider:  03/19/15  Faxed to [facility] @ [phone]   Jerelene ReddenSheena E Humble, 03/19/2015, 1:53 PM

## 2016-09-20 ENCOUNTER — Emergency Department (HOSPITAL_COMMUNITY)
Admission: EM | Admit: 2016-09-20 | Discharge: 2016-09-20 | Disposition: A | Discharge status: Home / Self Care | Payer: Self-pay | Attending: Emergency Medicine | Admitting: Emergency Medicine

## 2016-09-20 ENCOUNTER — Emergency Department (HOSPITAL_COMMUNITY): Payer: Self-pay

## 2016-09-20 ENCOUNTER — Encounter (HOSPITAL_COMMUNITY): Payer: Self-pay

## 2016-09-20 DIAGNOSIS — G8929 Other chronic pain: Secondary | ICD-10-CM

## 2016-09-20 DIAGNOSIS — X509XXA Other and unspecified overexertion or strenuous movements or postures, initial encounter: Secondary | ICD-10-CM | POA: Insufficient documentation

## 2016-09-20 DIAGNOSIS — Y9389 Activity, other specified: Secondary | ICD-10-CM | POA: Insufficient documentation

## 2016-09-20 DIAGNOSIS — M25571 Pain in right ankle and joints of right foot: Secondary | ICD-10-CM | POA: Insufficient documentation

## 2016-09-20 DIAGNOSIS — Z96651 Presence of right artificial knee joint: Secondary | ICD-10-CM | POA: Insufficient documentation

## 2016-09-20 DIAGNOSIS — Y999 Unspecified external cause status: Secondary | ICD-10-CM | POA: Insufficient documentation

## 2016-09-20 DIAGNOSIS — Y9289 Other specified places as the place of occurrence of the external cause: Secondary | ICD-10-CM | POA: Insufficient documentation

## 2016-09-20 MED ORDER — TRAMADOL HCL 50 MG PO TABS
50.0000 mg | ORAL_TABLET | Freq: Once | ORAL | Status: DC
  Filled 2016-09-20: qty 1

## 2016-09-20 MED ORDER — OXYCODONE-ACETAMINOPHEN 5-325 MG PO TABS
1.0000 | ORAL_TABLET | Freq: Once | ORAL | Status: DC
  Filled 2016-09-20: qty 1

## 2016-09-20 NOTE — ED Triage Notes (Signed)
Patient here with right lateral ankle pain after twisting same last pm, has had surgery to same in past, NAD

## 2016-09-20 NOTE — ED Notes (Signed)
Pt states he is allergic to tylenol. PA noted. Percocet discontinued. Tramadol order but pt denied wanting it.

## 2016-09-20 NOTE — ED Provider Notes (Signed)
[facility]EM[facility]Y DEPT Provider Note   CSN: [id number] Arrival date & time: 09/20/16  1118  By signing my name below, I, Doreatha MartinEva Mathews, attest that this documentation has been prepared under the direction and in the presence of [employer] InstrumentsSamantha Tripp Chantalle Defilippo, PA-C.  Electronically Signed: Doreatha MartinEva Mathews, ED Scribe. 09/20/16. 11:53 AM.     History   Chief Complaint Chief Complaint  Patient presents with  . Ankle Pain    HPI Gabriel FurlongRudolph T Bowker is a 35 y.o. male s/p surgical repair of right ankle and tib/fib 07/07/16 by [facility] who presents to the Emergency Department complaining of moderate, sharp right ankle pain s/p injury that occurred last night. Pt states he was going down the steps when he accidentally put increased direct pressure on the right ankle, causing increased pain from baseline. He denies falls, head injury. Pt states he has had pain consistently since his recent surgery; however, his pain worsened last night. He notes his pain is worsened with direct pressure, weight-bearing and ambulation. He reports he has taken Tramadol with no significant relief of pain. He denies additional injuries.   The history is provided by the patient. No language interpreter was used.    Past Medical History:  Diagnosis Date  . Acid reflux   . Anxiety   . Bipolar 1 disorder (HCC)   . Depression   . Polysubstance abuse    etoh, marijuana, molly, cocaine    Patient Active Problem List   Diagnosis Date Noted  . Substance abuse 03/09/2015  . Bipolar 1 disorder, mixed, moderate (HCC)   . Depression 03/08/2015    Past Surgical History:  Procedure Laterality Date  . ANKLE SURGERY    . KNEE ARTHROPLASTY    . KNEE SURGERY         Home Medications    Prior to Admission medications   Medication Sig Start Date End Date Taking? Authorizing Provider  divalproex (DEPAKOTE ER) 500 MG 24 hr tablet Take 1 tablet (500 mg total) by mouth 2 (two) times daily. For mood stabilization 03/14/15   Shuvon  B Rankin, NP  hydrOXYzine (ATARAX/VISTARIL) 25 MG tablet Take 25 mg by mouth 3 (three) times daily as needed for anxiety.    Historical Provider, MD  QUEtiapine (SEROQUEL) 200 MG tablet Take 1 tablet (200 mg total) by mouth at bedtime. For mood contol 03/14/15   Shuvon B Rankin, NP  QUEtiapine (SEROQUEL) 50 MG tablet Take 1 tablet (50 mg total) by mouth 2 (two) times daily. For mood control/depression 03/14/15   Shuvon B Rankin, NP  traZODone (DESYREL) 50 MG tablet Take 1 tablet (50 mg total) by mouth at bedtime as needed for sleep. 03/14/15   Shuvon B Rankin, NP    Family History No family history on file.  Social History Social History  Substance Use Topics  . Smoking status: Never Smoker  . Smokeless tobacco: Never Used  . Alcohol use Yes     Comment: daily, bingwe drinks on the weekend     Allergies   Review of patient's allergies indicates no known allergies.   Review of Systems Review of Systems A complete 10 system review of systems was obtained and all systems are negative except as noted in the HPI and PMH.    Physical Exam Updated Vital Signs BP 130/89 (BP Location: Left Arm)   Pulse 84   Temp 98.1 F (36.7 C) (Oral)   Resp 16   Ht 6\' 1"  (1.854 m)   Wt 276 lb (125.2  kg)   SpO2 97%   BMI 36.41 kg/m   Physical Exam  Constitutional: He is oriented to person, place, and time. He appears well-developed and well-nourished. No distress.  HENT:  Head: Normocephalic and atraumatic.  Eyes: Conjunctivae are normal. Right eye exhibits no discharge. Left eye exhibits no discharge. No scleral icterus.  Cardiovascular: Normal rate.   Pulmonary/Chest: Effort normal.  Musculoskeletal: He exhibits tenderness. He exhibits no deformity.  Well-healed surgical scar over lateral malleolus with TTP. No obvious bony deformity or swelling. Intact DPs.   Neurological: He is alert and oriented to person, place, and time. Coordination normal.  Skin: Skin is warm and dry. No rash noted.  He is not diaphoretic. No erythema. No pallor.  Psychiatric: He has a normal mood and affect. His behavior is normal.  Nursing note and vitals reviewed.    ED Treatments / Results   DIAGNOSTIC STUDIES: Oxygen Saturation is 97% on RA, normal by my interpretation.    COORDINATION OF CARE: 11:50 AM Discussed treatment plan with pt at bedside which includes XR and pt agreed to plan.     Radiology Dg Ankle Complete Right  Result Date: 09/20/2016 CLINICAL DATA:  Right ankle pain, most pronounced laterally, after twisting the ankle last night when going down stairs. EXAM: RIGHT ANKLE - COMPLETE 3+ VIEW COMPARISON:  01/24/2016. FINDINGS: Again demonstrated is screw and plate fusion of the distal fibula and tibia. There is interval partial bone fusion between these bones. Mild diffuse soft tissue swelling. No fracture, dislocation or effusion seen. There is a small amount of ill-defined calcific density superior to the talus. There are also multiple small calcifications or tiny metallic foreign bodies in the lateral soft tissues. IMPRESSION: No fracture. Electronically Signed   By: Beckie SaltsSteven  Reid M.D.   On: 09/20/2016 12:53    Procedures Procedures (including critical care time)  Medications Ordered in ED Medications  traMADol (ULTRAM) tablet 50 mg (not administered)     Initial Impression / Assessment and Plan / ED Course  I have reviewed the triage vital signs and the nursing notes.  Pertinent labs & imaging results that were available during my care of the patient were reviewed by me and considered in my medical decision making (see chart for details).  Clinical Course    Patient with exacerbated chronic right ankle pain s/p injury last night. Pt is [facility] Presentation concerning for new injury. X-rays reviewed by me revealed screw and plate fusion of the distal fibula and tibia consistent with prior XRs. No acute bony  abnormality, effusion, dislocation or fracture. Patient is neurovascularly intact distally and able to move ankle although states it is painful. Pt has documented h/o taking Percocet without adverse reaction and when offered dose of Percocet in the ED, declined and requested Oxycodone, stating he is allergic to Tylenol. When offered Tramadol instead, pt then requested Percocet. Upon review of patient's medical records, both his primary care provider and his orthopedic surgeon have documented the patient should not be given narcotics for this kind of pain. He has been referred to a pain clinic. Patient given cam walker in the ED. Discussed RICE and pain medication. Instructed patient to follow up with their orthopedist for reevaluation. Discussed strict return precautions to the ED. patient appears reliable and expressed understanding to the discharge instructions.     Final Clinical Impressions(s) / ED Diagnoses   Final diagnoses:  Chronic pain of right ankle  New Prescriptions New Prescriptions   No medications on file    I personally performed the services described in this documentation, which was scribed in my presence. The recorded information has been reviewed and is accurate.      Lester Kinsman [place], PA-C 09/21/16 1144    Geoffery Lyons, MD 09/24/16 1318

## 2016-09-20 NOTE — Progress Notes (Signed)
Orthopedic Tech Progress Note Patient Details:  Gabriel FurlongRudolph T Sou [birth year] [id number]  Ortho Devices Type of Ortho Device: CAM walker Ortho Device/Splint Location: rle Ortho Device/Splint Interventions: Application   [name] 09/20/2016, 1:24 PM

## 2016-09-20 NOTE — Discharge Instructions (Signed)
Follow up with you pain management clinic. Wear CAM walker boot as needed.

## 2016-11-21 ENCOUNTER — Inpatient Hospital Stay (HOSPITAL_COMMUNITY): Admission: AD | Admit: 2016-11-21 | Payer: Self-pay | Source: Intra-hospital | Admitting: Psychiatry

## 2016-11-21 ENCOUNTER — Inpatient Hospital Stay (HOSPITAL_COMMUNITY)
Admission: AD | Admit: 2016-11-21 | Discharge: 2016-11-30 | DRG: 885 | Disposition: A | Discharge status: Home / Self Care | Payer: Federal, State, Local not specified - Other | Source: Intra-hospital | Attending: Psychiatry | Admitting: Psychiatry

## 2016-11-21 ENCOUNTER — Emergency Department (HOSPITAL_COMMUNITY)
Admission: EM | Admit: 2016-11-21 | Discharge: 2016-11-21 | Disposition: A | Discharge status: Other Acute Inpatient Hospital | Payer: Self-pay | Attending: Physician Assistant | Admitting: Physician Assistant

## 2016-11-21 ENCOUNTER — Encounter (HOSPITAL_COMMUNITY): Payer: Self-pay | Admitting: Emergency Medicine

## 2016-11-21 ENCOUNTER — Encounter (HOSPITAL_COMMUNITY): Payer: Self-pay

## 2016-11-21 DIAGNOSIS — R45851 Suicidal ideations: Secondary | ICD-10-CM | POA: Diagnosis present

## 2016-11-21 DIAGNOSIS — F1510 Other stimulant abuse, uncomplicated: Secondary | ICD-10-CM | POA: Diagnosis present

## 2016-11-21 DIAGNOSIS — G4700 Insomnia, unspecified: Secondary | ICD-10-CM | POA: Diagnosis present

## 2016-11-21 DIAGNOSIS — F319 Bipolar disorder, unspecified: Secondary | ICD-10-CM | POA: Diagnosis not present

## 2016-11-21 DIAGNOSIS — F1220 Cannabis dependence, uncomplicated: Secondary | ICD-10-CM | POA: Diagnosis present

## 2016-11-21 DIAGNOSIS — F1210 Cannabis abuse, uncomplicated: Secondary | ICD-10-CM | POA: Insufficient documentation

## 2016-11-21 DIAGNOSIS — F1910 Other psychoactive substance abuse, uncomplicated: Secondary | ICD-10-CM

## 2016-11-21 DIAGNOSIS — Z79899 Other long term (current) drug therapy: Secondary | ICD-10-CM | POA: Insufficient documentation

## 2016-11-21 DIAGNOSIS — F411 Generalized anxiety disorder: Secondary | ICD-10-CM | POA: Diagnosis present

## 2016-11-21 DIAGNOSIS — K219 Gastro-esophageal reflux disease without esophagitis: Secondary | ICD-10-CM | POA: Diagnosis present

## 2016-11-21 DIAGNOSIS — F1020 Alcohol dependence, uncomplicated: Secondary | ICD-10-CM | POA: Diagnosis present

## 2016-11-21 DIAGNOSIS — F1610 Hallucinogen abuse, uncomplicated: Secondary | ICD-10-CM | POA: Diagnosis present

## 2016-11-21 DIAGNOSIS — R51 Headache: Secondary | ICD-10-CM | POA: Diagnosis present

## 2016-11-21 DIAGNOSIS — F3162 Bipolar disorder, current episode mixed, moderate: Secondary | ICD-10-CM | POA: Diagnosis present

## 2016-11-21 LAB — COMPREHENSIVE METABOLIC PANEL
ALBUMIN: 4.6 g/dL (ref 3.5–5.0)
ALT: 22 U/L (ref 17–63)
ANION GAP: 10 (ref 5–15)
AST: 26 U/L (ref 15–41)
Alkaline Phosphatase: 73 U/L (ref 38–126)
BUN: 9 mg/dL (ref 6–20)
CO2: 25 mmol/L (ref 22–32)
Calcium: 10.3 mg/dL (ref 8.9–10.3)
Chloride: 104 mmol/L (ref 101–111)
Creatinine, Ser: 0.91 mg/dL (ref 0.61–1.24)
GFR calc Af Amer: >60 mL/min (ref >60)
GFR calc non Af Amer: >60 mL/min (ref >60)
Glucose, Bld: 99 mg/dL (ref 65–99)
POTASSIUM: 3.9 mmol/L (ref 3.5–5.1)
Sodium: 139 mmol/L (ref 135–145)
Total Bilirubin: 1 mg/dL (ref 0.3–1.2)
Total Protein: 8.2 g/dL — ABNORMAL HIGH (ref 6.5–8.1)

## 2016-11-21 LAB — ACETAMINOPHEN LEVEL

## 2016-11-21 LAB — CBC
HEMATOCRIT: 46.9 % (ref 39.0–52.0)
HEMOGLOBIN: 16.7 g/dL (ref 13.0–17.0)
MCH: 30.7 pg (ref 26.0–34.0)
MCHC: 35.6 g/dL (ref 30.0–36.0)
MCV: 86.2 fL (ref 78.0–100.0)
Platelets: 382 10*3/uL (ref 150–400)
RBC: 5.44 MIL/uL (ref 4.22–5.81)
RDW: 13.8 % (ref 11.5–15.5)
WBC: 7.4 10*3/uL (ref 4.0–10.5)

## 2016-11-21 LAB — RAPID URINE DRUG SCREEN, HOSP PERFORMED
AMPHETAMINES: NOT DETECTED
BARBITURATES: NOT DETECTED
Benzodiazepines: NOT DETECTED
Cocaine: NOT DETECTED
OPIATES: NOT DETECTED
TETRAHYDROCANNABINOL: POSITIVE — AB

## 2016-11-21 LAB — ETHANOL: Alcohol, Ethyl (B): <5 mg/dL (ref <5)

## 2016-11-21 LAB — SALICYLATE LEVEL

## 2016-11-21 MED ORDER — QUETIAPINE FUMARATE 200 MG PO TABS: 200.0000 mg | ORAL_TABLET | Freq: Every day | ORAL | Status: DC

## 2016-11-21 MED ORDER — LORAZEPAM 1 MG PO TABS: 0.0000 mg | ORAL_TABLET | Freq: Two times a day (BID) | ORAL | Status: DC

## 2016-11-21 MED ORDER — HYDROXYZINE HCL 25 MG PO TABS: 25.0000 mg | ORAL_TABLET | Freq: Three times a day (TID) | ORAL | Status: DC | PRN

## 2016-11-21 MED ORDER — TRAZODONE HCL 50 MG PO TABS: 50.0000 mg | ORAL_TABLET | Freq: Every evening | ORAL | Status: DC | PRN

## 2016-11-21 MED ORDER — TRAZODONE HCL 50 MG PO TABS
50.0000 mg | ORAL_TABLET | Freq: Every evening | ORAL | Status: DC | PRN
  Administered 2016-11-21: 50 mg via ORAL
  Filled 2016-11-21: qty 1

## 2016-11-21 MED ORDER — LORAZEPAM 1 MG PO TABS
0.0000 mg | ORAL_TABLET | Freq: Four times a day (QID) | ORAL | Status: DC
  Administered 2016-11-21: 1 mg via ORAL
  Filled 2016-11-21: qty 1

## 2016-11-21 MED ORDER — SODIUM CHLORIDE 0.9 % IV BOLUS (SEPSIS)
1000.0000 mL | Freq: Once | INTRAVENOUS | Status: AC
  Administered 2016-11-21: 1000 mL via INTRAVENOUS

## 2016-11-21 MED ORDER — MAGNESIUM HYDROXIDE 400 MG/5ML PO SUSP
30.0000 mL | Freq: Every day | ORAL | Status: DC | PRN
  Administered 2016-11-27: 30 mL via ORAL
  Filled 2016-11-21: qty 30

## 2016-11-21 MED ORDER — DIVALPROEX SODIUM ER 500 MG PO TB24: 500.0000 mg | ORAL_TABLET | Freq: Two times a day (BID) | ORAL | Status: DC

## 2016-11-21 MED ORDER — NAPROXEN 500 MG PO TABS
500.0000 mg | ORAL_TABLET | Freq: Two times a day (BID) | ORAL | Status: DC | PRN
  Administered 2016-11-21 – 2016-11-28 (×5): 500 mg via ORAL
  Filled 2016-11-21 (×5): qty 1

## 2016-11-21 MED ORDER — LORAZEPAM 1 MG PO TABS
1.0000 mg | ORAL_TABLET | Freq: Four times a day (QID) | ORAL | Status: AC | PRN
  Administered 2016-11-22 – 2016-11-24 (×5): 1 mg via ORAL
  Filled 2016-11-21 (×6): qty 1

## 2016-11-21 MED ORDER — ONDANSETRON 4 MG PO TBDP: 4.0000 mg | ORAL_TABLET | Freq: Four times a day (QID) | ORAL | Status: AC | PRN

## 2016-11-21 MED ORDER — LORAZEPAM 2 MG/ML IJ SOLN
1.0000 mg | Freq: Once | INTRAMUSCULAR | Status: DC
  Filled 2016-11-21: qty 1

## 2016-11-21 MED ORDER — HYDROXYZINE HCL 25 MG PO TABS
25.0000 mg | ORAL_TABLET | Freq: Four times a day (QID) | ORAL | Status: AC | PRN
  Administered 2016-11-21 – 2016-11-24 (×3): 25 mg via ORAL
  Filled 2016-11-21 (×3): qty 1

## 2016-11-21 MED ORDER — ALUM & MAG HYDROXIDE-SIMETH 200-200-20 MG/5ML PO SUSP
30.0000 mL | ORAL | Status: DC | PRN
  Administered 2016-11-23 – 2016-11-29 (×6): 30 mL via ORAL
  Filled 2016-11-21 (×6): qty 30

## 2016-11-21 MED ORDER — QUETIAPINE FUMARATE 25 MG PO TABS
50.0000 mg | ORAL_TABLET | Freq: Two times a day (BID) | ORAL | Status: DC
Start: 2016-11-21 — End: 2016-11-21

## 2016-11-21 MED ORDER — LOPERAMIDE HCL 2 MG PO CAPS: 2.0000 mg | ORAL_CAPSULE | ORAL | Status: AC | PRN

## 2016-11-21 NOTE — ED Notes (Signed)
TTS at bedside. 

## 2016-11-21 NOTE — ED Provider Notes (Signed)
[facility] Provider Note   CSN: [id number] Arrival date & time: 11/21/16  1739     History   Chief Complaint Chief Complaint  Patient presents with  . Suicidal    HPI Deveron FurlongRudolph T Apperson is a 35 y.o. male.  HPI   Deveron FurlongRudolph T Gilkeson is a 35 y.o. male, with a history of Polysubstance abuse, depression, and bipolar, presenting to the ED with depression and SI since Christmas. Pt states, "I'm depressed and I've been thinking about killing myself. I have been taking Molly, smoking weed, and drinking alcohol trying to overdose." Pt states he has tried to do this before. Pt would not say how much alcohol he has been drinking. Has not been compliant with his medications for at least 2 months. Denies tobacco use. Denies A/V hallucinations. Pt states, "I think I'm dehydrated because I haven't been drinking much water."       Past Medical History:  Diagnosis Date  . Acid reflux   . Anxiety   . Bipolar 1 disorder (HCC)   . Depression   . Polysubstance abuse    etoh, marijuana, molly, cocaine    Patient Active Problem List   Diagnosis Date Noted  . Substance abuse 03/09/2015  . Bipolar 1 disorder, mixed, moderate (HCC)   . Depression 03/08/2015    Past Surgical History:  Procedure Laterality Date  . ANKLE SURGERY    . KNEE ARTHROPLASTY    . KNEE SURGERY         Home Medications    Prior to Admission medications   Medication Sig Start Date End Date Taking? Authorizing Provider  divalproex (DEPAKOTE ER) 500 MG 24 hr tablet Take 1 tablet (500 mg total) by mouth 2 (two) times daily. For mood stabilization Patient not taking: Reported on 11/21/2016 03/14/15   Shuvon B Rankin, NP  hydrOXYzine (ATARAX/VISTARIL) 25 MG tablet Take 25 mg by mouth 3 (three) times daily as needed for anxiety.    Historical Provider, MD  QUEtiapine (SEROQUEL) 200 MG tablet Take 1 tablet (200 mg total) by mouth at bedtime. For mood contol Patient not taking: Reported on 11/21/2016 03/14/15    Shuvon B Rankin, NP  QUEtiapine (SEROQUEL) 50 MG tablet Take 1 tablet (50 mg total) by mouth 2 (two) times daily. For mood control/depression Patient not taking: Reported on 11/21/2016 03/14/15   Shuvon B Rankin, NP  traZODone (DESYREL) 50 MG tablet Take 1 tablet (50 mg total) by mouth at bedtime as needed for sleep. Patient not taking: Reported on 11/21/2016 03/14/15   [place] NapShuvon B Rankin, NP    Family History No family history on file.  Social History Social History  Substance Use Topics  . Smoking status: Never Smoker  . Smokeless tobacco: Never Used  . Alcohol use Yes     Comment: daily, bingwe drinks on the weekend     Allergies   Tylenol [acetaminophen]   Review of Systems Review of Systems  Constitutional: Negative for chills and fever.  Respiratory: Negative for shortness of breath.   Cardiovascular: Negative for chest pain.  Gastrointestinal: Negative for abdominal pain, diarrhea, nausea and vomiting.  Neurological: Negative for dizziness, weakness, light-headedness, numbness and headaches.  Psychiatric/Behavioral: Positive for agitation, dysphoric mood, sleep disturbance and suicidal ideas.  All other systems reviewed and are negative.    Physical Exam Updated Vital Signs BP 135/93   Pulse 112   Temp 98.1 F (36.7 C) (Oral)   Resp 16   SpO2 100%   Physical Exam  Constitutional: He appears well-developed and well-nourished. No distress.  HENT:  Head: Normocephalic and atraumatic.  Eyes: Conjunctivae are normal.  Neck: Neck supple.  Cardiovascular: Normal rate, regular rhythm, normal heart sounds and intact distal pulses.   Pulmonary/Chest: Effort normal and breath sounds normal. No respiratory distress.  Abdominal: Soft. There is no tenderness. There is no guarding.  Musculoskeletal: He exhibits no edema.  Lymphadenopathy:    He has no cervical adenopathy.  Neurological: He is alert.  Skin: Skin is warm and dry. He is not diaphoretic.  Psychiatric: His  affect is labile. He is agitated (verbally, at minimal stimulation). He expresses suicidal ideation.  Appears on edge and somewhat agitated.  Nursing note and vitals reviewed.    ED Treatments / Results  Labs (all labs ordered are listed, but only abnormal results are displayed) Labs Reviewed  COMPREHENSIVE METABOLIC PANEL - Abnormal; Notable for the following:       Result Value   Total Protein 8.2 (*)    All other components within normal limits  RAPID URINE DRUG SCREEN, HOSP PERFORMED - Abnormal; Notable for the following:    Tetrahydrocannabinol POSITIVE (*)    All other components within normal limits  ACETAMINOPHEN LEVEL - Abnormal; Notable for the following:    Acetaminophen (Tylenol), Serum <10 (*)    All other components within normal limits  ETHANOL  CBC  SALICYLATE LEVEL    EKG  EKG Interpretation None       Radiology No results found.  Procedures Procedures (including critical care time)  Medications Ordered in ED Medications  LORazepam (ATIVAN) injection 1 mg (1 mg Intravenous Not Given 11/21/16 2059)  LORazepam (ATIVAN) tablet 0-4 mg (1 mg Oral Given 11/21/16 2057)    Followed by  LORazepam (ATIVAN) tablet 0-4 mg (not administered)  hydrOXYzine (ATARAX/VISTARIL) tablet 25 mg (not administered)  divalproex (DEPAKOTE ER) 24 hr tablet 500 mg (not administered)  QUEtiapine (SEROQUEL) tablet 200 mg (not administered)  QUEtiapine (SEROQUEL) tablet 50 mg (not administered)  traZODone (DESYREL) tablet 50 mg (not administered)  sodium chloride 0.9 % bolus 1,000 mL (1,000 mLs Intravenous New Bag/Given 11/21/16 2050)     Initial Impression / Assessment and Plan / ED Course  I have reviewed the triage vital signs and the nursing notes.  Pertinent labs & imaging results that were available during my care of the patient were reviewed by me and considered in my medical decision making (see chart for details).  Clinical Course     Patient presents with  suicidal ideations and some initial mild agitation. Agitation and tachycardia resolved with Ativan and IV fluids. Labs without significant abnormalities. Patient medically cleared. Patient verified the home medications he is supposed to be taking. These were ordered for him to be restarted during his psych hold.   9:39 PM Spoke with Ala DachFord, [facility], who States [facility] Jama Flavorsobos and can be transferred there as soon as he is medically cleared.   Vitals:   11/21/16 1813 11/21/16 1831 11/21/16 2152  BP: 123/92 135/93   Pulse: 99 112 85  Resp: 23 16 16   Temp: 98.1 F (36.7 C)    TempSrc: Oral    SpO2: 97% 100% 100%     Final Clinical Impressions(s) / ED Diagnoses   Final diagnoses:  Suicidal ideations  Polysubstance abuse    New Prescriptions New Prescriptions   No medications on file     Anselm PancoastShawn C Jaine Estabrooks, Cordelia Poche-C 11/21/16 2156  Courteney Randall An, MD 11/24/16 2357

## 2016-11-21 NOTE — ED Triage Notes (Signed)
Pt states he is having suicidal thoughts since Christmas -- has been on a 4 day binge of Kirt BoysMolly-- states has not slept or eaten in 4 days. Pt's plan to hurt himself was to overdose.

## 2016-11-21 NOTE — BH Assessment (Addendum)
Tele Assessment Note   Gabriel Barnes is an 35 y.o. spearated male who presents unaccompanied to [facility] reporting symptoms of depression and substance abuse. Pt has a documented diagnosis of Bipolar I Disorder and reports he stopped taking his psychiatric medications approximately two months ago for reasons he cannot explain. He says he often feels depressed this time of year because his parents died during the holiday season. Pt reports symptoms including crying spells, social withdrawal, loss of interest in usual pleasures, fatigue, irritability, decreased concentration, decreased sleep, decreased appetite and feelings of hopelessness. Pt also reports episodes of anxiety, fear and racing thoughts. He reports current suicidal ideation with thoughts of overdosing on substances. He reports he has attempted suicide in the past by overdose and by walking into traffic. He denies current homicidal ideation or history of violence. He denies current auditory or visual hallucinations but does report experiencing hallucinations in the past.   Pt reports he is drinking a fifth of liquor 2-3 times per week. He says he has been using approximately one gram of "Kirt BoysMolly" daily for the past four days. He says he normally uses [place]olly on weekends. Pt reports smoking approximately two blunts of marijuana daily. He denies withdrawal symptoms other than sweats. Pt has a history of using cocaine but denies recent use.   Pt identifies grieving his parents as his primary stressor. He is currently unemployed and living alone. He is separated from his wife and has three children who live out of state. He identifies is sister in [place] as his primary support.  Pt says he has received outpatient treatment through [facility] in the past but doesn't have a current provider. Pt's was last psychiatrically hospitalized at [facility] in April 2016. He has also been psychiat[place] in [place] in [place] and in [place] in  2008.   Pt is dressed in hospital scrubs, alert, oriented x4 with normal speech and normal motor behavior. Eye contact is good. Pt's mood is depressed and affect is congruent with mood. Thought process is coherent and relevant. There is no indication Pt is currently responding to internal stimuli or experiencing delusional thought content. Pt was calm and cooperative throughout assessment. He says he is willing to sign voluntarily into a psychiatric facility.   Diagnosis: Bipolar I Disorder, Current Episode Depressed, Severe Without Psychotic Features; Alcohol Use Disorder, Severe; Hallucinogen Use Disorder, Severe; Cannabis Use Disorder, Severe.  Past Medical History:  Past Medical History:  Diagnosis Date  . Acid reflux   . Anxiety   . Bipolar 1 disorder (HCC)   . Depression   . Polysubstance abuse    etoh, marijuana, molly, cocaine    Past Surgical History:  Procedure Laterality Date  . ANKLE SURGERY    . KNEE ARTHROPLASTY    . KNEE SURGERY      Family History: No family history on file.  Social History:  reports that he has never smoked. He has never used smokeless tobacco. He reports that he drinks alcohol. He reports that he uses drugs, including Marijuana and Cocaine, about 1 time per week.  Additional Social History:  Alcohol / Drug Use Pain Medications: Denies use Prescriptions: Stopped medications two months ago Over the Counter: Denies use History of alcohol / drug use?: Yes Longest period of sobriety (when/how long): One year Negative Consequences of Use: Personal relationships Withdrawal Symptoms: Sweats Substance #1 Name of Substance 1: Alcohol 1 - Age of First Use: Adolescent 1 - Amount (size/oz): One fifth liquor 1 -  Frequency: 2-3 times per week 1 - Duration: Ongoing 1 - Last Use / Amount: 11/20/16, One fifth liquor Substance #2 Name of Substance 2: Marijuana 2 - Age of First Use: 12 2 - Amount (size/oz): 2 blunts 2 - Frequency: daily 2 - Duration:  Ongoing 2 - Last Use / Amount: 11/21/16, two blunts Substance #3 Name of Substance 3: Mollys 3 - Age of First Use: 31 3 - Amount (size/oz): 1 gram 3 - Frequency: On weekends 3 - Duration: Ongoing, daily for the past four days 3 - Last Use / Amount: 11/21/16, one gram  CIWA: CIWA-Ar BP: 135/93 Pulse Rate: 112 COWS:    PATIENT STRENGTHS: (choose at least two) Ability for insight Average or above average intelligence Capable of independent living Communication skills General fund of knowledge Motivation for treatment/growth Physical Health Supportive family/friends  Allergies:  Allergies  Allergen Reactions  . Tylenol [Acetaminophen] Rash    Home Medications:  (Not in a hospital admission)  OB/GYN Status:  No LMP for male patient.  General Assessment Data Location of Assessment: [facility] ED TTS Assessment: In system Is this a Tele or Face-to-Face Assessment?: Tele Assessment Is this an Initial Assessment or a Re-assessment for this encounter?: Initial Assessment Marital status: Separated Maiden name: NA Is patient pregnant?: No Pregnancy Status: No Living Arrangements: Alone Can pt return to current living arrangement?: Yes Admission Status: Voluntary Is patient capable of signing voluntary admission?: Yes Referral Source: Self/Family/Friend Insurance type: Self-pay     Crisis Care Plan Living Arrangements: Alone Legal Guardian: Other: (Self) Name of Psychiatrist: None Name of Therapist: None  Education Status Is patient currently in school?: No Current Grade: NA Highest grade of school patient has completed: 12 Name of school: NA Contact person: NA  Risk to self with the past 6 months Suicidal Ideation: Yes-Currently Present Has patient been a risk to self within the past 6 months prior to admission? : Yes Suicidal Intent: Yes-Currently Present Has patient had any suicidal intent within the past 6 months prior to admission? : Yes Is patient at risk for  suicide?: Yes Suicidal Plan?: Yes-Currently Present Has patient had any suicidal plan within the past 6 months prior to admission? : Yes Specify Current Suicidal Plan: Overdose on substances Access to Means: Yes Specify Access to Suicidal Means: Access to multiple substances What has been your use of drugs/alcohol within the last 12 months?: Pt reports using alcohol, MDMA and marijuana Previous Attempts/Gestures: Yes How many times?: 1 (Pt reports he has attempted suicide by overdose) Other Self Harm Risks: None Triggers for Past Attempts: Family contact Intentional Self Injurious Behavior: None Family Suicide History: No Recent stressful life event(s): Job Loss, Financial Problems, Other (Comment) (Parents died during holiday season) Persecutory voices/beliefs?: No Depression: Yes Depression Symptoms: Despondent, Insomnia, Isolating, Fatigue, Guilt, Loss of interest in usual pleasures, Feeling worthless/self pity, Feeling angry/irritable, Tearfulness Substance abuse history and/or treatment for substance abuse?: Yes Suicide prevention information given to non-admitted patients: Not applicable  Risk to Others within the past 6 months Homicidal Ideation: No Does patient have any lifetime risk of violence toward others beyond the six months prior to admission? : No Thoughts of Harm to Others: No Current Homicidal Intent: No Current Homicidal Plan: No Access to Homicidal Means: No Identified Victim: None History of harm to others?: No Assessment of Violence: None Noted Violent Behavior Description: Pt denies history of violence Does patient have access to weapons?: No Criminal Charges Pending?: No Does patient have a court date: No  Is patient on probation?: No  Psychosis Hallucinations: None noted Delusions: None noted  Mental Status Report Appearance/Hygiene: In scrubs Eye Contact: Good Motor Activity: Unremarkable Speech: Logical/coherent Level of Consciousness:  Alert Mood: Depressed Affect: Depressed Anxiety Level: Minimal Thought Processes: Coherent, Relevant Judgement: Unimpaired Orientation: Person, Place, Time, Situation, Appropriate for developmental age Obsessive Compulsive Thoughts/Behaviors: None  Cognitive Functioning Concentration: Normal Memory: Recent Intact, Remote Intact IQ: Average Insight: Fair Impulse Control: Fair Appetite: Fair Weight Loss: 0 Weight Gain: 0 Sleep: Decreased Total Hours of Sleep: 4 Vegetative Symptoms: None  ADLScreening [facility] Assessment Services) Patient's cognitive ability adequate to safely complete daily activities?: Yes Patient able to express need for assistance with ADLs?: Yes Independently performs ADLs?: Yes (appropriate for developmental age)  Prior Inpatient Therapy Prior Inpatient Therapy: Yes Prior Therapy Dates: 02/2015; 2013; 2018 Prior Therapy Facilty/Provider([facility], other facilities Reason for Treatment: Bipolar disorder, substance abuse  Prior Outpatient Therapy Prior Outpatient Therapy: Yes Prior Therapy Dates: 2017 Prior Therapy Facilty/Provider(s): [facility] Reason for Treatment: Bipolar disorder Does patient have an ACCT team?: No Does patient have Intensive In-House Services?  : No Does patient have [facility] services? : No Does patient have P4CC services?: No  ADL Screening (condition at time of admission) Patient's cognitive ability adequate to safely complete daily activities?: Yes Is the patient deaf or have difficulty hearing?: No Does the patient have difficulty seeing, even when wearing glasses/contacts?: No Does the patient have difficulty concentrating, remembering, or making decisions?: No Patient able to express need for assistance with ADLs?: Yes Does the patient have difficulty dressing or bathing?: No Independently performs ADLs?: Yes (appropriate for developmental age) Does the patient have difficulty walking or climbing stairs?: No Weakness of  Legs: None Weakness of Arms/Hands: None  Home Assistive Devices/Equipment Home Assistive Devices/Equipment: None    Abuse/Neglect Assessment (Assessment to be complete while patient is alone) Physical Abuse: Denies Verbal Abuse: Denies Sexual Abuse: Denies Exploitation of patient/patient's resources: Denies Self-Neglect: Denies     Merchant navy officerAdvance Directives (For Healthcare) Does Patient Have a Medical Advance Directive?: No Would patient like information on creating a medical advance directive?: No - Patient declined    Additional Information 1:1 In Past 12 Months?: No CIRT Risk: No Elopement Risk: No Does patient have medical clearance?: Yes     Disposition: Binnie RailJoann Glover, AC at [facility], confirmed bed availability. Gave clinical report to Nira ConnJason Berry, NP who said Pt meets criteria for dual-diagnosis treatment and accepted Pt to the service of Dr. Carmon GinsbergF. Cobos, room 303-1. Notified Anselm PancoastShawn C Joy, PA-C and Ladona Ridgelaylor, RN of acceptance.  Disposition Initial Assessment Completed for this Encounter: Yes Disposition of Patient: Inpatient treatment program  Pamalee LeydenWarrick Jr, Alijah Hyde Ellis 11/21/2016 9:35 PM

## 2016-11-21 NOTE — ED Notes (Signed)
Pt provided with a sandwich and ginger ale

## 2016-11-22 ENCOUNTER — Encounter (HOSPITAL_COMMUNITY): Payer: Self-pay | Admitting: Psychiatry

## 2016-11-22 DIAGNOSIS — Z9889 Other specified postprocedural states: Secondary | ICD-10-CM

## 2016-11-22 DIAGNOSIS — Z888 Allergy status to other drugs, medicaments and biological substances status: Secondary | ICD-10-CM

## 2016-11-22 DIAGNOSIS — F3162 Bipolar disorder, current episode mixed, moderate: Principal | ICD-10-CM

## 2016-11-22 DIAGNOSIS — Z79899 Other long term (current) drug therapy: Secondary | ICD-10-CM

## 2016-11-22 DIAGNOSIS — F1020 Alcohol dependence, uncomplicated: Secondary | ICD-10-CM

## 2016-11-22 DIAGNOSIS — F1510 Other stimulant abuse, uncomplicated: Secondary | ICD-10-CM

## 2016-11-22 DIAGNOSIS — F1610 Hallucinogen abuse, uncomplicated: Secondary | ICD-10-CM | POA: Diagnosis present

## 2016-11-22 DIAGNOSIS — F1220 Cannabis dependence, uncomplicated: Secondary | ICD-10-CM

## 2016-11-22 DIAGNOSIS — Z813 Family history of other psychoactive substance abuse and dependence: Secondary | ICD-10-CM

## 2016-11-22 LAB — LIPID PANEL
Cholesterol: 175 mg/dL (ref 0–200)
HDL: 39 mg/dL — ABNORMAL LOW (ref >40)
LDL CALC: 103 mg/dL — AB (ref 0–99)
Total CHOL/HDL Ratio: 4.5 RATIO
Triglycerides: 165 mg/dL — ABNORMAL HIGH (ref <150)
VLDL: 33 mg/dL (ref 0–40)

## 2016-11-22 LAB — TSH: TSH: 4.147 u[IU]/mL (ref 0.350–4.500)

## 2016-11-22 MED ORDER — QUETIAPINE FUMARATE 25 MG PO TABS
25.0000 mg | ORAL_TABLET | Freq: Two times a day (BID) | ORAL | Status: DC
  Administered 2016-11-22 – 2016-11-27 (×10): 25 mg via ORAL
  Filled 2016-11-22 (×13): qty 1

## 2016-11-22 MED ORDER — QUETIAPINE FUMARATE 50 MG PO TABS
50.0000 mg | ORAL_TABLET | Freq: Every day | ORAL | Status: DC
  Administered 2016-11-22: 50 mg via ORAL
  Filled 2016-11-22 (×3): qty 1

## 2016-11-22 MED ORDER — QUETIAPINE FUMARATE 25 MG PO TABS
25.0000 mg | ORAL_TABLET | Freq: Every day | ORAL | Status: DC
  Filled 2016-11-22 (×2): qty 1

## 2016-11-22 MED ORDER — FLUTICASONE PROPIONATE 50 MCG/ACT NA SUSP
2.0000 | Freq: Every day | NASAL | Status: DC
  Administered 2016-11-22 – 2016-11-30 (×7): 2 via NASAL
  Filled 2016-11-22: qty 16

## 2016-11-22 MED ORDER — DIVALPROEX SODIUM 250 MG PO DR TAB
250.0000 mg | DELAYED_RELEASE_TABLET | Freq: Three times a day (TID) | ORAL | Status: DC
  Administered 2016-11-22 – 2016-11-26 (×13): 250 mg via ORAL
  Filled 2016-11-22 (×19): qty 1

## 2016-11-22 NOTE — Progress Notes (Signed)
Patient ID: Gabriel FurlongRudolph T Haddon, male   DOB: [birth year], 35 y.o.   MRN: [id number]   EKG completed and placed on chart. Jacquelyne BalintShalita Fain Francis RN

## 2016-11-22 NOTE — Progress Notes (Signed)
D    Pt is irritable and demanding   He has appropriate but limited interaction with others    He is medications seeking in that he request all the medications he can have as often as he can have them   He was wanting more medication for sleep and said he wasn't sleepy   He was standing in the hall barely keeping his eyes open A   Verbal support given   Medications administered and effectivenesss monitored   Q 15 min checks R   Pt safe at present

## 2016-11-22 NOTE — Tx Team (Signed)
Initial Treatment Plan 11/22/2016 12:16 AM Gabriel Barnes [id number]    PATIENT STRESSORS: Financial difficulties Health problems Medication change or noncompliance Substance abuse   PATIENT STRENGTHS: Active sense of humor General fund of knowledge Motivation for treatment/growth Supportive family/friends   PATIENT IDENTIFIED PROBLEMS: Risk for suicide  depression  anxiety  SA  "want to work on me"             DISCHARGE CRITERIA:  Improved stabilization in mood, thinking, and/or behavior Verbal commitment to aftercare and medication compliance  PRELIMINARY DISCHARGE PLAN: Attend aftercare/continuing care group Outpatient therapy  PATIENT/FAMILY INVOLVEMENT: This treatment plan has been presented to and reviewed with the patient, Gabriel Barnes.  The patient and family have been given the opportunity to ask questions and make suggestions.  Delos HaringPhillips, Bryson Palen A, RN 11/22/2016, 12:16 AM

## 2016-11-22 NOTE — Progress Notes (Signed)
Patient did attend the evening speaker AA meeting.  
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
## 2016-11-22 NOTE — [facility] Suicide Risk Assessment (Signed)
[facility] Admission Suicide Risk Assessment   Nursing information obtained from:    Demographic factors:    Current Mental Status:    Loss Factors:    Historical Factors:    Risk Reduction Factors:     Total Time spent with patient: 30 minutes Principal Problem: Bipolar 1 disorder, mixed, moderate (HCC) Diagnosis:   Patient Active Problem List   Diagnosis Date Noted  . Alcohol use disorder, moderate, dependence (HCC) [F10.20] 11/22/2016  . Cannabis use disorder, severe, dependence (HCC) [F12.20] 11/22/2016  . 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 Substance abuse [F19.10] 03/09/2015  . Bipolar 1 disorder, mixed, moderate (HCC) [F31.62]   . Depression [F32.9] 03/08/2015   Subjective Data: Patient presented with SI with plan to OD - has a hx of Bipolar do, polysubstance abuse - as noted above - restart Depakote and seroquel.  Continued Clinical Symptoms:  Alcohol Use Disorder Identification Test Final Score (AUDIT): 15 The "Alcohol Use Disorders Identification Test", Guidelines for Use in Primary Care, Second Edition.  World Science writerHealth Organization [facility](WHO). Score between 0-7:  no or low risk or alcohol related problems. Score between 8-15:  moderate risk of alcohol related problems. Score between 16-19:  high risk of alcohol related problems. Score 20 or above:  warrants further diagnostic evaluation for alcohol dependence and treatment.   CLINICAL FACTORS:   Alcohol/Substance Abuse/Dependencies Unstable or Poor Therapeutic Relationship Previous Psychiatric Diagnoses and Treatments   Musculoskeletal: Strength & Muscle Tone: within normal limits Gait & Station: normal Patient leans: N/A  Psychiatric Specialty Exam: Physical Exam  Review of Systems  Psychiatric/Behavioral: Positive for depression, substance abuse and suicidal ideas. The patient is nervous/anxious.   All other systems reviewed and are negative.   Blood pressure 119/82, pulse 78, temperature 97.6 F (36.4 C), resp. rate  20, height 5\' 11"  (1.803 m), weight 122.5 kg (270 lb).Body mass index is 37.66 kg/m.  General Appearance: Casual  Eye Contact:  Fair  Speech:  Clear and Coherent  Volume:  Normal  Mood:  Anxious and Depressed  Affect:  Appropriate  Thought Process:  Goal Directed and Descriptions of Associations: Circumstantial  Orientation:  Full (Time, Place, and Person)  Thought Content:  Rumination  Suicidal Thoughts:  Yes.  with intent/plan  Homicidal Thoughts:  No  Memory:  Immediate;   Fair Recent;   Fair Remote;   Fair  Judgement:  Fair  Insight:  Fair  Psychomotor Activity:  Normal  Concentration:  Concentration: Fair and Attention Span: Fair  Recall:  FiservFair  Fund of Knowledge:  Fair  Language:  Fair  Akathisia:  No  Handed:  Right  AIMS (if indicated):     Assets:  Communication Skills Desire for Improvement  ADL's:  Intact  Cognition:  WNL  Sleep:  Number of Hours: 2.75      COGNITIVE FEATURES THAT CONTRIBUTE TO RISK:  Closed-mindedness, Polarized thinking and Thought constriction (tunnel vision)    SUICIDE RISK:   Moderate:  Frequent suicidal ideation with limited intensity, and duration, some specificity in terms of plans, no associated intent, good self-control, limited dysphoria/symptomatology, some risk factors present, and identifiable protective factors, including available and accessible social support.   PLAN OF CARE: Please see H&P for plan.  I certify that inpatient services furnished can reasonably be expected to improve the patient's condition.  Shandi Godfrey, MD 11/22/2016, 11:30 AM

## 2016-11-22 NOTE — Progress Notes (Signed)
Patient ID: [name], male   DOB: [birth year], 35 y.o.   MRN: [id number]   D: Pt has been very flat and depressed on the unit today. Pt slept most of the morning, he did not attend any groups nor did he engage in treatment. Pt did get up at lunch and requested to be put back on his medication. May NP made was made aware of patients concerns, patient was started back on his medication. Pt reported that he was depressed somewhat, and that he was having lots of anxiety. Pt was given as needed medication with relief. Pt reported being negative SI/HI, no AH/VH noted. A: 15 min checks continued for patient safety. R: Pt safety maintained.

## 2016-11-22 NOTE — H&P (Signed)
Psychiatric Admission Assessment Adult  Patient Identification: Gabriel Barnes MRN:  [id number] Date of Evaluation:  11/22/2016 Chief Complaint:  bipolar disorder etoh use disorder cannabis use disorder Principal Diagnosis: Bipolar 1 disorder, mixed, moderate (HCC) Diagnosis:   Patient Active Problem List   Diagnosis Date Noted  . Alcohol use disorder, moderate, dependence (HCC) [F10.20] 11/22/2016  . Cannabis use disorder, severe, dependence (HCC) [F12.20] 11/22/2016  . 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 Substance abuse [F19.10] 03/09/2015  . Bipolar 1 disorder, mixed, moderate (HCC) [F31.62]   . Depression [F32.9] 03/08/2015   History of Present Illness: Gabriel Barnes is an 35 y.o.  [facility] reporting symptoms of depression and substance abuse. [facility] with a documented diagnosis of Bipolar I Disorder and reports he stopped taking his psychiatric medications approximately two months ago for reasons he cannot explain.  Upon admission, he reported current suicidal ideation with thoughts of overdosing on substances.  He denies SI HI currently.  He reports history of SA.    Pt reports he is drinking a fifth of liquor 2-3 times per week. He says he has been using approximately one gram of "Kirt BoysMolly" daily for the past four days. He says he normally uses [place]olly on weekends. Pt reports THC daily.   Pt says he has [facility] in the past but doesn't have a current provider. Pt's was [facility] psychiat[facility]. He has also been psychiatrically hospitalized in [place] in 2013 and in [place] in 2008.   Associated Signs/Symptoms: Depression Symptoms:  depressed mood, (Hypo) Manic Symptoms:  Impulsivity, Anxiety Symptoms:  Excessive Worry, Psychotic Symptoms:  NA PTSD Symptoms: NA Total Time spent with patient: 30 minutes  Past Psychiatric History: see HPI  Is the patient  at risk to self? Yes.    Has the patient been a risk to self in the past 6 months? Yes.    Has the patient been a risk to self within the distant past? Yes.    Is the patient a risk to others? No.  Has the patient been a risk to others in the past 6 months? No.  Has the patient been a risk to others within the distant past? No.   Prior Inpatient Therapy:   Prior Outpatient Therapy:    Alcohol Screening: 1. How often do you have a drink containing alcohol?: Monthly or less 2. How many drinks containing alcohol do you have on a typical day when you are drinking?: 5 or 6 3. How often do you have six or more drinks on one occasion?: Monthly Preliminary Score: 4 4. How often during the last year have you found that you were not able to stop drinking once you had started?: Monthly 5. How often during the last year have you failed to do what was normally expected from you becasue of drinking?: Never 6. How often during the last year have you needed a first drink in the morning to get yourself going after a heavy drinking session?: Never 7. How often during the last year have you had a feeling of guilt of remorse after drinking?: Monthly 8. How often during the last year have you been unable to remember what happened the night before because you had been drinking?: Monthly 9. Have you or someone else been injured as a result of your drinking?: No 10. Has a relative or friend or a doctor or another health worker been  concerned about your drinking or suggested you cut down?: Yes, during the last year Alcohol Use Disorder Identification Test Final Score (AUDIT): 15 Brief Intervention: Patient declined brief intervention Substance Abuse History in the last 12 months:  Yes.   Consequences of Substance Abuse: NA Previous Psychotropic Medications: Yes  Psychological Evaluations: Yes  Past Medical History:  Past Medical History:  Diagnosis Date  . Acid reflux   . Anxiety   . Bipolar 1 disorder (HCC)    . Depression   . Polysubstance abuse    etoh, marijuana, molly, cocaine    Past Surgical History:  Procedure Laterality Date  . ANKLE SURGERY    . KNEE ARTHROPLASTY    . KNEE SURGERY     Family History:  Family History  Problem Relation Age of Onset  . Drug abuse Mother   . Drug abuse Father    Family Psychiatric  History: see HPI Tobacco Screening: Have you used any form of tobacco in the last 30 days? (Cigarettes, Smokeless Tobacco, Cigars, and/or Pipes): No Social History:  History  Alcohol Use  . Yes    Comment: daily, bingwe drinks on the weekend     History  Drug Use  . Frequency: 1.0 time per week  . Types: Marijuana, Cocaine, MDMA (Ecstacy)    Comment: molly    Additional Social History:                           Allergies:   Allergies  Allergen Reactions  . Tylenol [Acetaminophen] Rash   Lab Results:  Results for orders placed or performed during the hospital encounter of 11/21/16 (from the past 48 hour(s))  TSH     Status: None   Collection Time: 11/22/16  6:21 AM  Result Value Ref Range   TSH 4.147 0.350 - 4.500 uIU/mL    Comment: Performed by a 3rd Generation assay with a functional sensitivity of <=0.01 uIU/mL. Performed at [facility]   Lipid panel     Status: Abnormal   Collection Time: 11/22/16  6:21 AM  Result Value Ref Range   Cholesterol 175 0 - 200 mg/dL   Triglycerides 562165 (H) <150 mg/dL   HDL 39 (L) >13>40 mg/dL   Total CHOL/HDL Ratio 4.5 RATIO   VLDL 33 0 - 40 mg/dL   LDL Cholesterol 086103 (H) 0 - 99 mg/dL    Comment:        Total Cholesterol/HDL:CHD Risk Coronary Heart Disease Risk Table                     Men   Women  1/2 Average Risk   3.4   3.3  Average Risk       5.0   4.4  2 X Average Risk   9.6   7.1  3 X Average Risk  23.4   11.0        Use the calculated Patient Ratio above and the CHD Risk Table to determine the patient's CHD Risk.        ATP III CLASSIFICATION (LDL):  <100     mg/dL    Optimal  [id number]  mg/dL   Near or Above                    Optimal  130-159  mg/dL   Borderline  [id number]  mg/dL   High  >413>190     mg/dL   Very  High Performed at [facility]     Blood Alcohol level:  Lab Results  Component Value Date   [facility] <5 11/21/2016   ETH <5 03/08/2015    Metabolic Disorder Labs:  No results found for: HGBA1C, MPG No results found for: PROLACTIN Lab Results  Component Value Date   CHOL 175 11/22/2016   TRIG 165 (H) 11/22/2016   HDL 39 (L) 11/22/2016   CHOLHDL 4.5 11/22/2016   VLDL 33 11/22/2016   LDLCALC 103 (H) 11/22/2016    Current Medications: Current Facility-Administered Medications  Medication Dose Route Frequency Provider Last Rate Last Dose  . alum & mag hydroxide-simeth (MAALOX/MYLANTA) 200-200-20 MG/5ML suspension 30 mL  30 mL Oral Q4H PRN Jackelyn Poling, NP      . divalproex (DEPAKOTE) DR tablet 250 mg  250 mg Oral Q8H Saramma Eappen, MD   250 mg at 11/22/16 1305  . fluticasone (FLONASE) 50 MCG/ACT nasal spray 2 spray  2 spray Each Nare Daily Adonis Brook, NP      . hydrOXYzine (ATARAX/VISTARIL) tablet 25 mg  25 mg Oral Q6H PRN Jackelyn Poling, NP   25 mg at 11/22/16 1305  . loperamide (IMODIUM) capsule 2-4 mg  2-4 mg Oral PRN Jackelyn Poling, NP      . LORazepam (ATIVAN) tablet 1 mg  1 mg Oral Q6H PRN Jackelyn Poling, NP   1 mg at 11/22/16 1305  . magnesium hydroxide (MILK OF MAGNESIA) suspension 30 mL  30 mL Oral Daily PRN Jackelyn Poling, NP      . naproxen (NAPROSYN) tablet 500 mg  500 mg Oral BID PRN Jackelyn Poling, NP   500 mg at 11/21/16 2342  . ondansetron (ZOFRAN-ODT) disintegrating tablet 4 mg  4 mg Oral Q6H PRN Jackelyn Poling, NP      . QUEtiapine (SEROQUEL) tablet 25 mg  25 mg Oral BID Adonis Brook, NP      . QUEtiapine (SEROQUEL) tablet 50 mg  50 mg Oral QHS Adonis Brook, NP       PTA Medications: Prescriptions Prior to Admission  Medication Sig Dispense Refill Last Dose  . divalproex (DEPAKOTE ER) 500 MG 24 hr tablet Take  1 tablet (500 mg total) by mouth 2 (two) times daily. For mood stabilization (Patient not taking: Reported on 11/21/2016) 60 tablet 0 Not Taking at Unknown time  . hydrOXYzine (ATARAX/VISTARIL) 25 MG tablet Take 25 mg by mouth 3 (three) times daily as needed for anxiety.   Not Taking at Unknown time  . QUEtiapine (SEROQUEL) 200 MG tablet Take 1 tablet (200 mg total) by mouth at bedtime. For mood contol (Patient not taking: Reported on 11/21/2016) 30 tablet 0 Not Taking at Unknown time  . QUEtiapine (SEROQUEL) 50 MG tablet Take 1 tablet (50 mg total) by mouth 2 (two) times daily. For mood control/depression (Patient not taking: Reported on 11/21/2016) 60 tablet 0 Not Taking at Unknown time  . traZODone (DESYREL) 50 MG tablet Take 1 tablet (50 mg total) by mouth at bedtime as needed for sleep. (Patient not taking: Reported on 11/21/2016) 30 tablet 0 Not Taking at Unknown time    Musculoskeletal: Strength & Muscle Tone: within normal limits Gait & Station: normal Patient leans: N/A  Psychiatric Specialty Exam: Physical Exam  Nursing note and vitals reviewed.   ROS  Blood pressure 116/78, pulse 79, temperature 97.6 F (36.4 C), resp. rate 20, height 5\' 11"  (1.803 m), weight 122.5 kg (270 lb).Body mass index is  37.66 kg/m.  General Appearance: Neat  Eye Contact:  Fair  Speech:  Clear and Coherent  Volume:  Normal  Mood:  Anxious and Depressed  Affect:  Congruent  Thought Process:  Coherent  Orientation:  Full (Time, Place, and Person)  Thought Content:  Logical and Rumination  Suicidal Thoughts:  No  Homicidal Thoughts:  No  Memory:  Immediate;   Fair Recent;   Fair Remote;   Fair  Judgement:  Fair  Insight:  Fair  Psychomotor Activity:  Normal  Concentration:  Concentration: Fair and Attention Span: Fair  Recall:  [employer] of Knowledge:  Fair  Language:  Fair  Akathisia:  No  Handed:  Right  AIMS (if indicated):     Assets:  Physical Health Resilience Social Support   ADL's:  Intact  Cognition:  WNL  Sleep:  Number of Hours: 2.75   Treatment Plan Summary: Review of chart, vital signs, medications, and notes.  1-Individual and group therapy  2-Medication management for depression and anxiety: Medications reviewed with the patient and she stated no untoward effects, unchanged.  3-Coping skills for depression, anxiety  4-Continue crisis stabilization and management  5-Address health issues--monitoring vital signs, stable  6-Treatment plan in progress to prevent relapse of depression and anxiety  Observation Level/Precautions:  15 minute checks  Laboratory:  per ED  Psychotherapy:  group  Medications:  As per medlist  Consultations:  As needed  Discharge Concerns:  safety  Estimated LOS:  2- 7 days  Other:     Physician Treatment Plan for Primary Diagnosis: Bipolar 1 disorder, mixed, moderate (HCC) Long Term Goal(s): Improvement in symptoms so as ready for discharge  Short Term Goals: Ability to identify changes in lifestyle to reduce recurrence of condition will improve, Ability to verbalize feelings will improve, Ability to disclose and discuss suicidal ideas, Ability to demonstrate self-control will improve, Ability to identify and develop effective coping behaviors will improve, Ability to maintain clinical measurements within normal limits will improve, Compliance with prescribed medications will improve and Ability to identify triggers associated with substance abuse/mental health issues will improve  Physician Treatment Plan for Secondary Diagnosis: Principal Problem:   Bipolar 1 disorder, mixed, moderate (HCC) Active Problems:   Alcohol use disorder, moderate, dependence (HCC)   Cannabis use disorder, severe, dependence (HCC)   MDMA abuse  Long Term Goal(s): Improvement in symptoms so as ready for discharge  Short Term Goals: Ability to identify changes in lifestyle to reduce recurrence of condition will improve, Ability to verbalize  feelings will improve, Ability to disclose and discuss suicidal ideas, Ability to demonstrate self-control will improve, Ability to identify and develop effective coping behaviors will improve, Ability to maintain clinical measurements within normal limits will improve, Compliance with prescribed medications will improve and Ability to identify triggers associated with substance abuse/mental health issues will improve  I certify that inpatient services furnished can reasonably be expected to improve the patient's condition.    Lindwood Qua, NP [facility] 12/31/20171:09 PM

## 2016-11-22 NOTE — Progress Notes (Signed)
Patient ID: Gabriel Barnes, male   DOB: 05/24/1981, 35 y.o.   MRN: [id number]  Admission Note:  D:35 yr male who presents [facility] in no acute distress for the treatment of SI and Depression. Pt appears flat and depressed. Pt was calm and cooperative with admission process. Pt presents with passive SI and contracts for safety upon admission. Pt denies AVH . Pt stated he has issues during the holidays missing his parents. Pt said since Christmas pt been feeling down and went on a 4 day binge of (ETOH, Molly, cocaine, whatever else he could get), pt stated he believes he has ADHD even though there is no current diagnosis listed. Pt stated he stopped taking his meds 2 months ago.  A:Skin was assessed and found to be clear of any abnormal marks apart from surgical scars on R-ankle, L-knee. PT searched and no contraband found, POC and unit policies explained and understanding verbalized. Consents obtained. Food and fluids offered, and  Accepted.  R: Pt had no additional questions or concerns.

## 2016-11-22 NOTE — [facility] Group Notes (Signed)
[facility] Group Notes: (Clinical Social Work)  12/31/20171:30-2:15pm  Summary of Progress/Problems: The main focus of today's process group was to write a goodbye letter to some element of 2017 that had negatively affected patients' lives, explaining how that thing (i.e., drinking, using drugs) used to be a "friend" but has now been causing harm and needs to be left behind. An emphasis was placed on being able to say goodbye again to the same thing without feeling guilty that it has arisen again, as this is a normal grieving process. Patients provided good support to each other.  The patient stated he wants to say goodbye to drugs and be on his medications without trying to go off them.  He stated he has been a "miserable father" in many ways but was a good father in some, wants to be present for his children from now on.  He provided much support to others.  Type of Therapy: Process Group  Participation Level: Active  Participation Quality: Attentive, Sharing and Supportive  Affect: Appropriate  Cognitive: Appropriate and Oriented  Insight: Engaged  Engagement in Therapy: Engaged  Modes of Intervention: Education, Support and Processing, Activity  Gabriel MantleMareida Grossman-Orr, LCSW 11/22/2016

## 2016-11-23 DIAGNOSIS — Z818 Family history of other mental and behavioral disorders: Secondary | ICD-10-CM

## 2016-11-23 LAB — PROLACTIN: Prolactin: 30.5 ng/mL — ABNORMAL HIGH (ref 4.0–15.2)

## 2016-11-23 MED ORDER — OLANZAPINE 5 MG PO TBDP
5.0000 mg | ORAL_TABLET | Freq: Once | ORAL | Status: AC
  Administered 2016-11-23: 5 mg via ORAL
  Filled 2016-11-23 (×2): qty 1

## 2016-11-23 MED ORDER — QUETIAPINE FUMARATE 100 MG PO TABS
100.0000 mg | ORAL_TABLET | Freq: Every day | ORAL | Status: DC
  Administered 2016-11-23 – 2016-11-25 (×3): 100 mg via ORAL
  Filled 2016-11-23 (×6): qty 1

## 2016-11-23 MED ORDER — OLANZAPINE 5 MG PO TBDP: 5.0000 mg | ORAL_TABLET | Freq: Every day | ORAL | Status: DC

## 2016-11-23 NOTE — Progress Notes (Signed)
D: Patient's self inventory sheet, patient has fair sleep, sleep medications helpful.  Good appetite, low energy level, poor concentration.  Rated depression, hopeless and anxiety 7.  Withdrawals, cramping.  SI, contracts for safety.  Sometimes does have SI thoughts.  Denied physical problems.  Physical pain, ankle, worst pain #8.  Does take pain medication.  Goal is "myself".  Plans to practice.  No discharge plans. A:  Medications administered per MD orders.  Emotional support and encouragement given patient. R:  Denied SI and HI, contracts for safety.  Denied A/V hallucinations.  Safety maintained with 15 minute checks.

## 2016-11-23 NOTE — Progress Notes (Addendum)
Patient has slept all day, got up and wanted ativan for anxiety. Stated nurse is not trying to help him, coping skills are not working for him.  Only wants his ativan.  Another nurse and MHT talked to him about coping skills.  Only thing he wants is ativan.  Patient's roommate also upset when he  woke up and wants pills for anxiety without working on coping skills.  Staff has talked to patients about using their coping skills. NP aware of patients' behaviors.  Medication ordered for patients.

## 2016-11-23 NOTE — [facility] Group Notes (Signed)
[facility] LCSW Group Therapy  11/23/2016 2:57 PM  Type of Therapy:  Group Therapy  Participation Level:  Did Not Attend    Karlee Staff C Jadin Kagel 11/23/2016, 2:57 PM 

## 2016-11-23 NOTE — Tx Team (Signed)
Interdisciplinary Treatment and Diagnostic Plan Update  11/23/2016 Time of Session: 9:30AM Gabriel Barnes MRN: [id number]  Principal Diagnosis: Bipolar 1 disorder, mixed, moderate (HCC)  Secondary Diagnoses: Principal Problem:   Bipolar 1 disorder, mixed, moderate (HCC) Active Problems:   Alcohol use disorder, moderate, dependence (HCC)   Cannabis use disorder, severe, dependence (HCC)   MDMA abuse   Current Medications:  Current Facility-Administered Medications  Medication Dose Route Frequency Provider Last Rate Last Dose  . alum & mag hydroxide-simeth (MAALOX/MYLANTA) 200-200-20 MG/5ML suspension 30 mL  30 mL Oral Q4H PRN Jackelyn Poling, NP      . divalproex (DEPAKOTE) DR tablet 250 mg  250 mg Oral Q8H Saramma Eappen, MD   250 mg at 11/23/16 0828  . fluticasone (FLONASE) 50 MCG/ACT nasal spray 2 spray  2 spray Each Nare Daily Adonis Brook, NP   2 spray at 11/23/16 0827  . hydrOXYzine (ATARAX/VISTARIL) tablet 25 mg  25 mg Oral Q6H PRN Jackelyn Poling, NP   25 mg at 11/22/16 1305  . loperamide (IMODIUM) capsule 2-4 mg  2-4 mg Oral PRN Jackelyn Poling, NP      . LORazepam (ATIVAN) tablet 1 mg  1 mg Oral Q6H PRN Jackelyn Poling, NP   1 mg at 11/22/16 2005  . magnesium hydroxide (MILK OF MAGNESIA) suspension 30 mL  30 mL Oral Daily PRN Jackelyn Poling, NP      . naproxen (NAPROSYN) tablet 500 mg  500 mg Oral BID PRN Jackelyn Poling, NP   500 mg at 11/21/16 2342  . ondansetron (ZOFRAN-ODT) disintegrating tablet 4 mg  4 mg Oral Q6H PRN Jackelyn Poling, NP      . QUEtiapine (SEROQUEL) tablet 25 mg  25 mg Oral BID Adonis Brook, NP   25 mg at 11/23/16 0829  . QUEtiapine (SEROQUEL) tablet 50 mg  50 mg Oral QHS Adonis Brook, NP   50 mg at 11/22/16 2127   PTA Medications: Prescriptions Prior to Admission  Medication Sig Dispense Refill Last Dose  . divalproex (DEPAKOTE ER) 500 MG 24 hr tablet Take 1 tablet (500 mg total) by mouth 2 (two) times daily. For mood stabilization (Patient not taking:  Reported on 11/21/2016) 60 tablet 0 Not Taking at Unknown time  . hydrOXYzine (ATARAX/VISTARIL) 25 MG tablet Take 25 mg by mouth 3 (three) times daily as needed for anxiety.   Not Taking at Unknown time  . QUEtiapine (SEROQUEL) 200 MG tablet Take 1 tablet (200 mg total) by mouth at bedtime. For mood contol (Patient not taking: Reported on 11/21/2016) 30 tablet 0 Not Taking at Unknown time  . QUEtiapine (SEROQUEL) 50 MG tablet Take 1 tablet (50 mg total) by mouth 2 (two) times daily. For mood control/depression (Patient not taking: Reported on 11/21/2016) 60 tablet 0 Not Taking at Unknown time  . traZODone (DESYREL) 50 MG tablet Take 1 tablet (50 mg total) by mouth at bedtime as needed for sleep. (Patient not taking: Reported on 11/21/2016) 30 tablet 0 Not Taking at Unknown time    Patient Stressors: Financial difficulties Health problems Medication change or noncompliance Substance abuse  Patient Strengths: Active sense of humor General fund of knowledge Motivation for treatment/growth Supportive family/friends  Treatment Modalities: Medication Management, Group therapy, Case management,  1 to 1 session with clinician, Psychoeducation, Recreational therapy.   Physician Treatment Plan for Primary Diagnosis: Bipolar 1 disorder, mixed, moderate (HCC) Long Term Goal(s): Improvement in symptoms so as ready for discharge Improvement in  symptoms so as ready for discharge   Short Term Goals: Ability to identify changes in lifestyle to reduce recurrence of condition will improve Ability to verbalize feelings will improve Ability to disclose and discuss suicidal ideas Ability to demonstrate self-control will improve Ability to identify and develop effective coping behaviors will improve Ability to maintain clinical measurements within normal limits will improve Compliance with prescribed medications will improve Ability to identify triggers associated with substance abuse/mental health issues  will improve Ability to identify changes in lifestyle to reduce recurrence of condition will improve Ability to verbalize feelings will improve Ability to disclose and discuss suicidal ideas Ability to demonstrate self-control will improve Ability to identify and develop effective coping behaviors will improve Ability to maintain clinical measurements within normal limits will improve Compliance with prescribed medications will improve Ability to identify triggers associated with substance abuse/mental health issues will improve  Medication Management: Evaluate patient's response, side effects, and tolerance of medication regimen.  Therapeutic Interventions: 1 to 1 sessions, Unit Group sessions and Medication administration.  Evaluation of Outcomes: Progressing  Physician Treatment Plan for Secondary Diagnosis: Principal Problem:   Bipolar 1 disorder, mixed, moderate (HCC) Active Problems:   Alcohol use disorder, moderate, dependence (HCC)   Cannabis use disorder, severe, dependence (HCC)   MDMA abuse  Long Term Goal(s): Improvement in symptoms so as ready for discharge Improvement in symptoms so as ready for discharge   Short Term Goals: Ability to identify changes in lifestyle to reduce recurrence of condition will improve Ability to verbalize feelings will improve Ability to disclose and discuss suicidal ideas Ability to demonstrate self-control will improve Ability to identify and develop effective coping behaviors will improve Ability to maintain clinical measurements within normal limits will improve Compliance with prescribed medications will improve Ability to identify triggers associated with substance abuse/mental health issues will improve Ability to identify changes in lifestyle to reduce recurrence of condition will improve Ability to verbalize feelings will improve Ability to disclose and discuss suicidal ideas Ability to demonstrate self-control will  improve Ability to identify and develop effective coping behaviors will improve Ability to maintain clinical measurements within normal limits will improve Compliance with prescribed medications will improve Ability to identify triggers associated with substance abuse/mental health issues will improve     Medication Management: Evaluate patient's response, side effects, and tolerance of medication regimen.  Therapeutic Interventions: 1 to 1 sessions, Unit Group sessions and Medication administration.  Evaluation of Outcomes: Progressing   RN Treatment Plan for Primary Diagnosis: Bipolar 1 disorder, mixed, moderate (HCC) Long Term Goal(s): Knowledge of disease and therapeutic regimen to maintain health will improve  Short Term Goals: Ability to remain free from injury will improve, Ability to disclose and discuss suicidal ideas and Ability to identify and develop effective coping behaviors will improve  Medication Management: RN will administer medications as ordered by provider, will assess and evaluate patient's response and provide education to patient for prescribed medication. RN will report any adverse and/or side effects to prescribing provider.  Therapeutic Interventions: 1 on 1 counseling sessions, Psychoeducation, Medication administration, Evaluate responses to treatment, Monitor vital signs and CBGs as ordered, Perform/monitor CIWA, COWS, AIMS and Fall Risk screenings as ordered, Perform wound care treatments as ordered.  Evaluation of Outcomes: Progressing   LCSW Treatment Plan for Primary Diagnosis: Bipolar 1 disorder, mixed, moderate (HCC) Long Term Goal(s): Safe transition to appropriate next level of care at discharge, Engage patient in therapeutic group addressing interpersonal concerns.  Short Term Goals: Engage patient  in aftercare planning with referrals and resources, Facilitate patient progression through stages of change regarding substance use diagnoses and  concerns and Identify triggers associated with mental health/substance abuse issues  Therapeutic Interventions: Assess for all discharge needs, 1 to 1 time with Social worker, Explore available resources and support systems, Assess for adequacy in community support network, Educate family and significant other(s) on suicide prevention, Complete Psychosocial Assessment, Interpersonal group therapy.  Evaluation of Outcomes: Progressing   Progress in Treatment: Attending groups: Yes. Participating in groups: Yes. Taking medication as prescribed: Yes. Toleration medication: Yes. Family/Significant other contact made: No, will contact:  family member if patient consents Patient understands diagnosis: Yes. Discussing patient identified problems/goals with staff: Yes. Medical problems stabilized or resolved: Yes. Denies suicidal/homicidal ideation: Yes. Issues/concerns per patient self-inventory: No. Other: n/a  New problem(s) identified: No, Describe:  n/a  New Short Term/Long Term Goal(s): detox; medication stabilization; development of comprehensive mental wellness/sobriety plan.   Discharge Plan or Barriers: CSW assessing-pt interested in [facility] for follow-up and in [facility].   Reason for Continuation of Hospitalization: Anxiety Depression Medication stabilization Withdrawal symptoms  Estimated Length of Stay: 1-3 days   Attendees: Patient: 11/23/2016 8:55 AM  Physician: Dr. Elna Breslow MD 11/23/2016 8:55 AM  Nursing: Mordecai Rasmussen RN 11/23/2016 8:55 AM  RN Care Manager:x 11/23/2016 8:55 AM  Social Worker: Trula Slade, LCSW; Santa Genera LCSW 11/23/2016 8:55 AM  Recreational Therapist:  11/23/2016 8:55 AM  Other: May Augustin NP 11/23/2016 8:55 AM  Other:  11/23/2016 8:55 AM  Other: 11/23/2016 8:55 AM    Scribe for Treatment Team: Ledell Peoples Smart, LCSW 11/23/2016 8:55 AM

## 2016-11-23 NOTE — Plan of Care (Signed)
Problem: Education: Goal: Utilization of techniques to improve thought processes will improve Outcome: Progressing Nurse discussed depression/anxiety/coping skills with patients.

## 2016-11-23 NOTE — Progress Notes (Signed)
[facility]  11/23/2016 1:30 PM Deveron FurlongRudolph T Capizzi  MRN:  [id number] Subjective:  Gabriel Gandyudolph states, "I still feel like I"m not getting enough sleep.  I have a lot on my mind and keeps me awake at night." Objective:  Gab[facility]udolph [facility] symptoms of depression and substance abuse, Bipolar I Disorder after he stopped taking his psychiatric medications approximately two months ago for reasons he cannot explain.  He is pleasant.  He does report some irritability and  rumination at night that prevents him from falling asleep.    Principal Problem: Bipolar 1 disorder, mixed, moderate (HCC) Diagnosis:   Patient Active Problem List   Diagnosis Date Noted  . Alcohol use disorder, moderate, dependence (HCC) [F10.20] 11/22/2016  . Cannabis use disorder, severe, dependence (HCC) [F12.20] 11/22/2016  . 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 Substance abuse [F19.10] 03/09/2015  . Bipolar 1 disorder, mixed, moderate (HCC) [F31.62]   . Depression [F32.9] 03/08/2015   Total Time spent with patient: 30 minutes  Past Psychiatric History: see HPI  Past Medical History:  Past Medical History:  Diagnosis Date  . Acid reflux   . Anxiety   . Bipolar 1 disorder (HCC)   . Depression   . Polysubstance abuse    etoh, marijuana, molly, cocaine    Past Surgical History:  Procedure Laterality Date  . ANKLE SURGERY    . KNEE ARTHROPLASTY    . KNEE SURGERY     Family History:  Family History  Problem Relation Age of Onset  . Drug abuse Mother   . Drug abuse Father    Family Psychiatric  History: see HPI Social History:  History  Alcohol Use  . Yes    Comment: daily, bingwe drinks on the weekend     History  Drug Use  . Frequency: 1.0 time per week  . Types: Marijuana, Cocaine, MDMA (Ecstacy)    Comment: molly    Social History   Social History  . Marital status: Single    Spouse name: N/A  . Number of children: N/A  . Years of education: N/A   Social History Main  Topics  . Smoking status: Never Smoker  . Smokeless tobacco: Never Used  . Alcohol use Yes     Comment: daily, bingwe drinks on the weekend  . Drug use:     Frequency: 1.0 time per week    Types: Marijuana, Cocaine, MDMA (Ecstacy)     Comment: molly  . Sexual activity: Yes    Birth control/ protection: None   Other Topics Concern  . None   Social History Narrative  . None   Additional Social History:                         Sleep: Fair  Appetite:  Fair  Current Medications: Current Facility-Administered Medications  Medication Dose Route Frequency Provider Last Rate Last Dose  . alum & mag hydroxide-simeth (MAALOX/MYLANTA) 200-200-20 MG/5ML suspension 30 mL  30 mL Oral Q4H PRN Jackelyn PolingJason A Berry, NP      . divalproex (DEPAKOTE) DR tablet 250 mg  250 mg Oral Q8H Saramma Eappen, MD   250 mg at 11/23/16 0828  . fluticasone (FLONASE) 50 MCG/ACT nasal spray 2 spray  2 spray Each Nare Daily Adonis BrookSheila Gustavia Carie, NP   2 spray at 11/23/16 0827  . hydrOXYzine (ATARAX/VISTARIL) tablet 25 mg  25 mg Oral Q6H PRN Jackelyn PolingJason A Berry, NP   25 mg  at 11/22/16 1305  . loperamide (IMODIUM) capsule 2-4 mg  2-4 mg Oral PRN Jackelyn Poling, NP      . LORazepam (ATIVAN) tablet 1 mg  1 mg Oral Q6H PRN Jackelyn Poling, NP   1 mg at 11/22/16 2005  . magnesium hydroxide (MILK OF MAGNESIA) suspension 30 mL  30 mL Oral Daily PRN Jackelyn Poling, NP      . naproxen (NAPROSYN) tablet 500 mg  500 mg Oral BID PRN Jackelyn Poling, NP   500 mg at 11/21/16 2342  . ondansetron (ZOFRAN-ODT) disintegrating tablet 4 mg  4 mg Oral Q6H PRN Jackelyn Poling, NP      . QUEtiapine (SEROQUEL) tablet 100 mg  100 mg Oral QHS Adonis Brook, NP      . QUEtiapine (SEROQUEL) tablet 25 mg  25 mg Oral BID Adonis Brook, NP   25 mg at 11/23/16 1610    Lab Results:  Results for orders placed or performed during the hospital encounter of 11/21/16 (from the past 48 hour(s))  TSH     Status: None   Collection Time: 11/22/16  6:21 AM  Result  Value Ref Range   TSH 4.147 0.350 - 4.500 uIU/mL    Comment: Performed by a 3rd Generation assay with a functional sensitivity of <=0.01 uIU/mL. Performed at [facility]   Prolactin     Status: Abnormal   Collection Time: 11/22/16  6:21 AM  Result Value Ref Range   Prolactin 30.5 (H) 4.0 - 15.2 ng/mL    Comment: (NOTE) Performed At: [facility] [address] [place], [place] [id number] Mila Homer MD JX:[id number] Performed at [facility]   Lipid panel     Status: Abnormal   Collection Time: 11/22/16  6:21 AM  Result Value Ref Range   Cholesterol 175 0 - 200 mg/dL   Triglycerides 130 (H) <150 mg/dL   HDL 39 (L) >86 mg/dL   Total CHOL/HDL Ratio 4.5 RATIO   VLDL 33 0 - 40 mg/dL   LDL Cholesterol 578 (H) 0 - 99 mg/dL    Comment:        Total Cholesterol/HDL:CHD Risk Coronary Heart Disease Risk Table                     Men   Women  1/2 Average Risk   3.4   3.3  Average Risk       5.0   4.4  2 X Average Risk   9.6   7.1  3 X Average Risk  23.4   11.0        Use the calculated Patient Ratio above and the CHD Risk Table to determine the patient's CHD Risk.        ATP III CLASSIFICATION (LDL):  <100     mg/dL   Optimal  469-629  mg/dL   Near or Above                    Optimal  130-159  mg/dL   Borderline  528-413  mg/dL   High  >244     mg/dL   Very High Performed at [facility]     Blood Alcohol level:  Lab Results  Component Value Date   [facility] <5 11/21/2016   ETH <5 03/08/2015    Metabolic Disorder Labs: No results found for: HGBA1C, MPG Lab Results  Component Value Date   PROLACTIN 30.5 (H)  11/22/2016   Lab Results  Component Value Date   CHOL 175 11/22/2016   TRIG 165 (H) 11/22/2016   HDL 39 (L) 11/22/2016   CHOLHDL 4.5 11/22/2016   VLDL 33 11/22/2016   LDLCALC 103 (H) 11/22/2016    Physical Findings: AIMS:  , ,  ,  ,    CIWA:  CIWA-Ar Total: 3 COWS:     Musculoskeletal: Strength &  Muscle Tone: within normal limits Gait & Station: normal Patient leans: N/A  Psychiatric Specialty Exam: Physical Exam  Nursing note and vitals reviewed.   ROS  Blood pressure 116/84, pulse (!) 109, temperature 98 F (36.7 C), resp. rate 20, height 5\' 11"  (1.803 m), weight 122.5 kg (270 lb).Body mass index is 37.66 kg/m.  General Appearance: Neat  Eye Contact:  Fair  Speech:  Clear and Coherent  Volume:  Normal  Mood:  Anxious and Depressed  Affect:  Congruent  Thought Process:  Coherent   Orientation:  Full (Time, Place, and Person)  Thought Content:  Rumination  Suicidal Thoughts:  No  Homicidal Thoughts:  No  Memory:  Immediate;   Fair Recent;   Fair Remote;   Fair  Judgement:  Fair  Insight:  Fair  Psychomotor Activity:  Normal  Concentration:  Concentration: Fair and Attention Span: Fair  Recall:  [employer] of Knowledge:  Good  Language:  Good  Akathisia:  Negative  Handed:  Right  AIMS (if indicated):     Assets:  Desire for Improvement Resilience  ADL's:  Intact  Cognition:  WNL  Sleep:  Number of Hours: 4.25   Treatment Plan Summary: Review of chart, vital signs, medications, and notes.  1-Individual and group therapy  2-Medication management for depression and anxiety: Medications reviewed with the patient. Seroquel 100 mg QHS and 25 mg BID mood stabilization, Depakote DR 250 mg Q8HRS mood stabilization, Ativan CIWA withdrawal, Vistaril 25 mg PRN anxiety 3-Coping skills for depression, anxiety  4-Continue crisis stabilization and management  5-Address health issues--monitoring vital signs, stable  6-Treatment plan in progress to prevent relapse of depression and anxiety  Davon Abdelaziz May Joshalyn Ancheta, NP [facility] 11/23/2016, 1:30 PM

## 2016-11-24 LAB — HEMOGLOBIN A1C
HEMOGLOBIN A1C: 5.9 % — AB (ref 4.8–5.6)
Hgb A1c MFr Bld: 5.7 % — ABNORMAL HIGH (ref 4.8–5.6)
MEAN PLASMA GLUCOSE: 117 mg/dL
Mean Plasma Glucose: 123 mg/dL

## 2016-11-24 MED ORDER — OLANZAPINE 5 MG PO TBDP
5.0000 mg | ORAL_TABLET | Freq: Three times a day (TID) | ORAL | Status: DC | PRN
  Administered 2016-11-25 – 2016-11-26 (×3): 5 mg via ORAL
  Filled 2016-11-24 (×3): qty 1

## 2016-11-24 NOTE — Plan of Care (Signed)
Problem: Safety: Goal: Periods of time without injury will increase Outcome: Progressing Patient remains safe on the unit at this time. Patient contracts for safety and is on q15 minute safety checks. Patient is a low falls risk; fall safety reviewed with patient. Patient able to express needs. Vital signs stable.     

## 2016-11-24 NOTE — Progress Notes (Signed)
Nursing Progress Note 7p-7a  D) Patient presents anxious. Patient states he has "heartburn". Patient denies SI/HI/AVH or pain. Patient contracts for safety at this time.   A) Emotional support given. Patient provided ginger ale and medication for heartburn. Patient medicated with PM orders. Patient on q15 min safety checks. Opportunities for questions or concerns presented to patient. Patient encouraged to continue to work on treatment plan.  R) Patient receptive to interaction with nurse. Patient remains safe on the unit at this time. Patient is resting in bed without complaints. Will continue to monitor.

## 2016-11-24 NOTE — Progress Notes (Signed)
D:  Patient's self inventory sheet, patient has fair sleep, no sleep medication administered.  Fair appetite, low energy level, poor concentration.  Rated depression, hopeless and anxiety 8.  Withdrawals, cold sweats.  SI, sometimes, contracts for safety.  Denied physical problems.  Pain, worst pain #6 in past 24 hours, ankle.  Pain medication not helpful.  Goal is myself.  Plans to work on himself.  No discharge plans. A:  Medications administered per MD orders.  Emotional support and encouragement given patient. R:  Denied HI.  Denied A/V hallucinations.  SI, contracts for safety.  Safety maintained with 15 minute checks.

## 2016-11-24 NOTE — Progress Notes (Signed)
Recreation Therapy Notes  Animal-Assisted Activity (AAA) Program Checklist/Progress Notes Patient Eligibility Criteria Checklist & Daily Group note for Rec TxIntervention  Date: 01.02.2018 Time: 2:45pm [facility]    AAA/T Program Assumption of Risk Form signed by Patient/ or Parent Legal Guardian Yes  Patient is free of allergies or sever asthma Yes  Patient reports no fear of animals Yes  Patient reports no history of cruelty to animals Yes  Patient understands his/her participation is voluntary Yes  Patient washes hands before animal contact Yes  Patient washes hands after animal contact Yes  Behavioral Response: Appropriate   Education:Hand Washing, Appropriate Animal Interaction   Education Outcome: Acknowledges education.   Clinical Observations/Feedback: Patient attended session and interacted appropriately with therapy dog and peers.    Marykay Lexenise L Eleonore Shippee, LRT/CTRS  Sherryll Skoczylas L 11/24/2016 3:08 PM

## 2016-11-24 NOTE — [facility] Group Notes (Signed)
The focus of this group is to educate the patient on the purpose and policies of crisis stabilization and provide a format to answer questions about their admission.  The group details unit policies and expectations of patients while admitted.  Patient did not attend 0900 nurse education orientation group this morning.  Patient stayed in room.  

## 2016-11-24 NOTE — Progress Notes (Signed)
[facility] MD Progress Note  11/24/2016 4:35 PM Gabriel Barnes  MRN:  [id number] Subjective:  Gabriel Barnes states, "I still feel like I"m not getting enough sleep.  "During the day I'm bothered about thoughts."  Objective:  Gabriel Barnes presented  to [facility] reporting symptoms of depression and substance abuse, Bipolar I Disorder after he stopped taking his psychiatric medications approximately two months ago for reasons he cannot explain.  He is pleasant.  He does report some irritability and  rumination at night that prevents him from falling asleep.    Principal Problem: Bipolar 1 disorder, mixed, moderate (HCC) Diagnosis:   Patient Active Problem List   Diagnosis Date Noted  . Alcohol use disorder, moderate, dependence (HCC) [F10.20] 11/22/2016  . Cannabis use disorder, severe, dependence (HCC) [F12.20] 11/22/2016  . 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 Substance abuse [F19.10] 03/09/2015  . Bipolar 1 disorder, mixed, moderate (HCC) [F31.62]   . Depression [F32.9] 03/08/2015   Total Time spent with patient: 30 minutes  Past Psychiatric History: see HPI  Past Medical History:  Past Medical History:  Diagnosis Date  . Acid reflux   . Anxiety   . Bipolar 1 disorder (HCC)   . Depression   . Polysubstance abuse    etoh, marijuana, molly, cocaine    Past Surgical History:  Procedure Laterality Date  . ANKLE SURGERY    . KNEE ARTHROPLASTY    . KNEE SURGERY     Family History:  Family History  Problem Relation Age of Onset  . Drug abuse Mother   . Drug abuse Father    Family Psychiatric  History: see HPI Social History:  History  Alcohol Use  . Yes    Comment: daily, bingwe drinks on the weekend     History  Drug Use  . Frequency: 1.0 time per week  . Types: Marijuana, Cocaine, MDMA (Ecstacy)    Comment: molly    Social History   Social History  . Marital status: Single    Spouse name: N/A  . Number of children: N/A  . Years of education: N/A   Social History Main Topics  .  Smoking status: Never Smoker  . Smokeless tobacco: Never Used  . Alcohol use Yes     Comment: daily, bingwe drinks on the weekend  . Drug use:     Frequency: 1.0 time per week    Types: Marijuana, Cocaine, MDMA (Ecstacy)     Comment: molly  . Sexual activity: Yes    Birth control/ protection: None   Other Topics Concern  . None   Social History Narrative  . None   Additional Social History:                         Sleep: Fair  Appetite:  Fair  Current Medications: Current Facility-Administered Medications  Medication Dose Route Frequency Provider Last Rate Last Dose  . alum & mag hydroxide-simeth (MAALOX/MYLANTA) 200-200-20 MG/5ML suspension 30 mL  30 mL Oral Q4H PRN Jackelyn Poling, NP   30 mL at 11/23/16 2147  . divalproex (DEPAKOTE) DR tablet 250 mg  250 mg Oral Q8H Saramma Eappen, MD   250 mg at 11/24/16 1447  . fluticasone (FLONASE) 50 MCG/ACT nasal spray 2 spray  2 spray Each Nare Daily Adonis Brook, NP   2 spray at 11/24/16 0855  . hydrOXYzine (ATARAX/VISTARIL) tablet 25 mg  25 mg Oral Q6H PRN Jackelyn Poling, NP   25 mg at 11/24/16  [id number]  . loperamide (IMODIUM) capsule 2-4 mg  2-4 mg Oral PRN Jackelyn PolingJason A Berry, NP      . LORazepam (ATIVAN) tablet 1 mg  1 mg Oral Q6H PRN Jackelyn PolingJason A Berry, NP   1 mg at 11/24/16 1450  . magnesium hydroxide (MILK OF MAGNESIA) suspension 30 mL  30 mL Oral Daily PRN Jackelyn PolingJason A Berry, NP      . naproxen (NAPROSYN) tablet 500 mg  500 mg Oral BID PRN Jackelyn PolingJason A Berry, NP   500 mg at 11/21/16 2342  . OLANZapine zydis (ZYPREXA) disintegrating tablet 5 mg  5 mg Oral TID PRN Adonis BrookSheila Lallie Strahm, NP      . ondansetron (ZOFRAN-ODT) disintegrating tablet 4 mg  4 mg Oral Q6H PRN Jackelyn PolingJason A Berry, NP      . QUEtiapine (SEROQUEL) tablet 100 mg  100 mg Oral QHS Adonis BrookSheila Turner Baillie, NP   100 mg at 11/23/16 2147  . QUEtiapine (SEROQUEL) tablet 25 mg  25 mg Oral BID Adonis BrookSheila Andreya Lacks, NP   25 mg at 11/24/16 [id number]    Lab Results:  Results for orders placed or performed during the  hospital encounter of 11/21/16 (from the past 48 hour(s))  Hemoglobin A1c     Status: Abnormal   Collection Time: 11/23/16  6:20 AM  Result Value Ref Range   Hgb A1c MFr Bld 5.9 (H) 4.8 - 5.6 %    Comment: (NOTE)         Pre-diabetes: 5.7 - 6.4         Diabetes: >6.4         Glycemic control for adults with diabetes: <7.0    Mean Plasma Glucose 123 mg/dL    Comment: (NOTE) Performed At: [facility] [address] [place], [place] [id number] Mila HomerHancock William F MD AO:[phone] [facility]     Blood Alcohol level:  Lab Results  Component Value Date   [facility] 11/[facility]2016   ETH <5 03/08/2015    Metabolic Disorder Labs: Lab Results  Component Value Date   HGBA1C 5.9 (H) 11/23/2016   MPG 123 11/23/2016   MPG 117 11/22/2016   Lab Results  Component Value Date   PROLACTIN 30.5 (H) 11/22/2016   Lab Results  Component Value Date   CHOL 175 11/22/2016   TRIG 165 (H) 11/22/2016   HDL 39 (L) 11/22/2016   CHOLHDL 4.5 11/22/2016   VLDL 33 11/22/2016   LDLCALC 103 (H) 11/22/2016    Physical Findings: AIMS: Facial and Oral Movements Muscles of Facial Expression: None, normal Lips and Perioral Area: None, normal Jaw: None, normal Tongue: None, normal,Extremity Movements Upper (arms, wrists, hands, fingers): None, normal Lower (legs, knees, ankles, toes): None, normal, Trunk Movements Neck, shoulders, hips: None, normal, Overall Severity Severity of abnormal movements (highest score from questions above): None, normal Incapacitation due to abnormal movements: None, normal Patient's awareness of abnormal movements (rate only patient's report): No Awareness, Dental Status Current problems with teeth and/or dentures?: No Does patient usually wear dentures?: No  CIWA:  CIWA-Ar Total: 1 COWS:  COWS Total Score: 2  Musculoskeletal: Strength & Muscle Tone: within normal limits Gait & Station: normal Patient leans: N/A  Psychiatric  Specialty Exam: Physical Exam  Nursing note and vitals reviewed.   ROS  Blood pressure 123/82, pulse (!) 118, temperature 97.7 F (36.5 C), temperature source Oral, resp. rate 17, height 5\' 11"  (1.803 m), weight 122.5 kg (270 lb).Body mass index is 37.66 kg/m.  General Appearance: Neat  Eye Contact:  Fair  Speech:  Clear and Coherent  Volume:  Normal  Mood:  Anxious and Depressed  Affect:  Congruent  Thought Process:  Coherent   Orientation:  Full (Time, Place, and Person)  Thought Content:  Rumination  Suicidal Thoughts:  No  Homicidal Thoughts:  No  Memory:  Immediate;   Fair Recent;   Fair Remote;   Fair  Judgement:  Fair  Insight:  Fair  Psychomotor Activity:  Normal  Concentration:  Concentration: Fair and Attention Span: Fair  Recall:  [employer] of Knowledge:  Good  Language:  Good  Akathisia:  Negative  Handed:  Right  AIMS (if indicated):     Assets:  Desire for Improvement Resilience  ADL's:  Intact  Cognition:  WNL  Sleep:  Number of Hours: 4.25   Treatment Plan Summary: Review of chart, vital signs, medications, and notes.  1-Individual and group therapy  2-Medication management for depression and anxiety: Medications reviewed with the patient. Seroquel 100 mg QHS and 25 mg BID mood stabilization, Depakote DR 250 mg Q8HRS mood stabilization, Ativan CIWA withdrawal, Vistaril 25 mg PRN anxiety, Added 5 Zyprexa zydis PRN TID agitation 3-Coping skills for depression, anxiety  4-Continue crisis stabilization and management  5-Address health issues--monitoring vital signs, stable  6-Treatment plan in progress to prevent relapse of depression and anxiety  Gabriel Barnes May Motty Borin, NP [facility] 11/24/2016, 4:35 PM

## 2016-11-24 NOTE — [facility] Group Notes (Signed)
[facility] LCSW Group Therapy  11/24/2016 3:49 PM  Type of Therapy:  Group Therapy  Participation Level:  Did Not Attend -pt invited. Chose to rest in room.   Summary of Progress/Problems: Today's Topic: Overcoming Obstacles. Patients identified one short term goal and potential obstacles in reaching this goal. Patients processed barriers involved in overcoming these obstacles. Patients identified steps necessary for overcoming these obstacles and explored motivation (internal and external) for facing these difficulties head on.   Erinn Huskins N Smart LCSW 11/24/2016, 3:49 PM

## 2016-11-24 NOTE — Plan of Care (Signed)
Problem: Safety: Goal: Periods of time without injury will increase Outcome: Progressing Pt. denies SI/HI/AVH at this time, remains a low fall risk, Q 15 checks in place for safety.    

## 2016-11-24 NOTE — Plan of Care (Signed)
Problem: Education: Goal: Ability to make informed decisions regarding treatment will improve Outcome: Progressing Nurse discussed suicidal thoughts/depression/coping skills with patient.        

## 2016-11-25 NOTE — Progress Notes (Signed)
  DATA ACTION RESPONSE  Objective- Pt. is up and visible in the room, seen resting in bed. Pt. presents with a depressed/anxious affect and mood. Pt. appears to be minimal/cautious with interaction. Subjective- Denies having any SI/HI/AVH/Pain at this time. Pt. states " I am fine; Can I get my meds. and a snack?". Pt. continues to be cooperative and remain safe on the unit.  1:1 interaction in private to establish rapport. Encouragement, education, & support given from staff. Meds. ordered and administered. Labs in the a.m.   Safety maintained with Q 15 checks. Continues to follow treatment plan and will monitor closely. No additonal questions/concerns noted.

## 2016-11-25 NOTE — Plan of Care (Signed)
Problem: Activity: Goal: Sleeping patterns will improve Outcome: Progressing Pt. slept a total of 6 hrs. last night.

## 2016-11-25 NOTE — Progress Notes (Signed)
Patient ID: Gabriel FurlongRudolph T Barnes, male   DOB: 1981-07-26, 36 y.o.   MRN: [id number] D:Affect is appropriate to mood. Rates his depression as a 7 and anxiety as 10 today. Says that he is trying to not over think things in his life or read too much into what others may think about him. Admits to feeling lonely and says that he is not a happy person. A:Support and encouragement offered. R:Receptive. No complaints of pain or problems at this time.

## 2016-11-25 NOTE — [facility] Group Notes (Signed)
[facility] LCSW Group Therapy  11/25/2016 2:33 PM  Type of Therapy:  Group Therapy  Participation Level:  Active  Participation Quality:  Attentive  Affect:  Blunted  Cognitive:  Oriented  Insight:  Limited  Engagement in Therapy:  Improving  Modes of Intervention:  Discussion, Education, Exploration, Problem-solving, Rapport Building, Socialization and Support  Summary of Progress/Problems: Emotion Regulation: This group focused on both positive and negative emotion identification and allowed group members to process ways to identify feelings, regulate negative emotions, and find healthy ways to manage internal/external emotions. Group members were asked to reflect on a time when their reaction to an emotion led to a negative outcome and explored how alternative responses using emotion regulation would have benefited them. Group members were also asked to discuss a time when emotion regulation was utilized when a negative emotion was experienced.   Emmons Toth N Smart LCSW 11/25/2016, 2:33 PM

## 2016-11-25 NOTE — Progress Notes (Signed)
Recreation Therapy Notes  Date: 11/25/16 Time: 0930 Location: 300 [address] Dayroom  Group Topic: Stress Management  Goal Area(s) Addresses:  Patient will verbalize importance of using healthy stress management.  Patient will identify positive emotions associated with healthy stress management.   Intervention: Stress Management  Activity :  Letting Go Meditation.  LRT introduced to stress management technique of meditation.  LRT played Barnes meditation focused on letting go of the past and focusing on the now from the Calm app.  Patients were to follow along as the meditation played to fully engage in the technique.  Education:  Stress Management, Discharge Planning.   Education Outcome: Acknowledges edcuation/In group clarification offered/Needs additional education  Clinical Observations/Feedback: Pt did not attend group.    Gabriel Barnes, LRT/CTRS         Gabriel Barnes 11/25/2016 11:28 AM 

## 2016-11-25 NOTE — Progress Notes (Signed)
[facility]  11/25/2016 1:51 PM Gabriel Barnes  MRN:  [id number] Subjective:  Gabriel Barnes reports that the meds seems to be working.  The Zyprexa does help with anxiety outbursts. Objective:  [facility] symptoms of depression and substance abuse, Bipolar I Disorder.  He is pleasant.  He does report some irritability and  rumination at night that prevents him from falling asleep.     Principal Problem: Bipolar 1 disorder, mixed, moderate (HCC) Diagnosis:   Patient Active Problem List   Diagnosis Date Noted  . Alcohol use disorder, moderate, dependence (HCC) [F10.20] 11/22/2016  . Cannabis use disorder, severe, dependence (HCC) [F12.20] 11/22/2016  . 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 Substance abuse [F19.10] 03/09/2015  . Bipolar 1 disorder, mixed, moderate (HCC) [F31.62]   . Depression [F32.9] 03/08/2015   Total Time spent with patient: 30 minutes  Past Psychiatric History: see HPI  Past Medical History:  Past Medical History:  Diagnosis Date  . Acid reflux   . Anxiety   . Bipolar 1 disorder (HCC)   . Depression   . Polysubstance abuse    etoh, marijuana, molly, cocaine    Past Surgical History:  Procedure Laterality Date  . ANKLE SURGERY    . KNEE ARTHROPLASTY    . KNEE SURGERY     Family History:  Family History  Problem Relation Age of Onset  . Drug abuse Mother   . Drug abuse Father    Family Psychiatric  History: see HPI Social History:  History  Alcohol Use  . Yes    Comment: daily, bingwe drinks on the weekend     History  Drug Use  . Frequency: 1.0 time per week  . Types: Marijuana, Cocaine, MDMA (Ecstacy)    Comment: molly    Social History   Social History  . Marital status: Single    Spouse name: N/A  . Number of children: N/A  . Years of education: N/A   Social History Main Topics  . Smoking status: Never Smoker  . Smokeless tobacco: Never Used  . Alcohol use Yes     Comment: daily, bingwe drinks on the  weekend  . Drug use:     Frequency: 1.0 time per week    Types: Marijuana, Cocaine, MDMA (Ecstacy)     Comment: molly  . Sexual activity: Yes    Birth control/ protection: None   Other Topics Concern  . None   Social History Narrative  . None   Additional Social History:                         Sleep: Fair  Appetite:  Fair  Current Medications: Current Facility-Administered Medications  Medication Dose Route Frequency Provider Last Rate Last Dose  . alum & mag hydroxide-simeth (MAALOX/MYLANTA) 200-200-20 MG/5ML suspension 30 mL  30 mL Oral Q4H PRN Jackelyn PolingJason A Berry, NP   30 mL at 11/23/16 2147  . divalproex (DEPAKOTE) DR tablet 250 mg  250 mg Oral Q8H Saramma Eappen, MD   250 mg at 11/25/16 1320  . fluticasone (FLONASE) 50 MCG/ACT nasal spray 2 spray  2 spray Each Nare Daily Adonis BrookSheila Agustin, NP   2 spray at 11/25/16 0807  . magnesium hydroxide (MILK OF MAGNESIA) suspension 30 mL  30 mL Oral Daily PRN Jackelyn PolingJason A Berry, NP      . naproxen (NAPROSYN) tablet 500 mg  500 mg Oral BID PRN Jackelyn PolingJason A Berry, NP  500 mg at 11/25/16 0636  . OLANZapine zydis (ZYPREXA) disintegrating tablet 5 mg  5 mg Oral TID PRN Adonis Brook, NP   5 mg at 11/25/16 1128  . QUEtiapine (SEROQUEL) tablet 100 mg  100 mg Oral QHS Adonis Brook, NP   100 mg at 11/24/16 2139  . QUEtiapine (SEROQUEL) tablet 25 mg  25 mg Oral BID Adonis Brook, NP   25 mg at 11/25/16 4696    Lab Results:  No results found for this or any previous visit (from the past 48 hour(s)).  Blood Alcohol level:  Lab Results  Component Value Date   [facility] <5 11/21/2016   ETH <5 03/08/2015    Metabolic Disorder Labs: Lab Results  Component Value Date   HGBA1C 5.9 (H) 11/23/2016   MPG 123 11/23/2016   MPG 117 11/22/2016   Lab Results  Component Value Date   PROLACTIN 30.5 (H) 11/22/2016   Lab Results  Component Value Date   CHOL 175 11/22/2016   TRIG 165 (H) 11/22/2016   HDL 39 (L) 11/22/2016   CHOLHDL 4.5 11/22/2016    VLDL 33 11/22/2016   LDLCALC 103 (H) 11/22/2016    Physical Findings: AIMS: Facial and Oral Movements Muscles of Facial Expression: None, normal Lips and Perioral Area: None, normal Jaw: None, normal Tongue: None, normal,Extremity Movements Upper (arms, wrists, hands, fingers): None, normal Lower (legs, knees, ankles, toes): None, normal, Trunk Movements Neck, shoulders, hips: None, normal, Overall Severity Severity of abnormal movements (highest score from questions above): None, normal Incapacitation due to abnormal movements: None, normal Patient's awareness of abnormal movements (rate only patient's report): No Awareness, Dental Status Current problems with teeth and/or dentures?: No Does patient usually wear dentures?: No  CIWA:  CIWA-Ar Total: 3 COWS:  COWS Total Score: 2  Musculoskeletal: Strength & Muscle Tone: within normal limits Gait & Station: normal Patient leans: N/A  Psychiatric Specialty Exam: Physical Exam  Nursing note and vitals reviewed.   ROS  Blood pressure 126/80, pulse 91, temperature 97.9 F (36.6 C), temperature source Oral, resp. rate 18, height 5\' 11"  (1.803 m), weight 122.5 kg (270 lb).Body mass index is 37.66 kg/m.  General Appearance: Neat  Eye Contact:  Fair  Speech:  Clear and Coherent  Volume:  Normal  Mood:  Anxious and Depressed  Affect:  Congruent  Thought Process:  Coherent   Orientation:  Full (Time, Place, and Person)  Thought Content:  Rumination  Suicidal Thoughts:  No  Homicidal Thoughts:  No  Memory:  Immediate;   Fair Recent;   Fair Remote;   Fair  Judgement:  Fair  Insight:  Fair  Psychomotor Activity:  Normal  Concentration:  Concentration: Fair and Attention Span: Fair  Recall:  [employer] of Knowledge:  Good  Language:  Good  Akathisia:  Negative  Handed:  Right  AIMS (if indicated):     Assets:  Desire for Improvement Resilience  ADL's:  Intact  Cognition:  WNL  Sleep:  Number of Hours: 6   Treatment  Plan Summary: Review of chart, vital signs, medications, and notes.  1-Individual and group therapy  2-Medication management for depression and anxiety: Medications reviewed with the patient. Seroquel 100 mg QHS and 25 mg BID mood stabilization, Depakote DR 250 mg Q8HRS mood stabilization, Ativan CIWA withdrawal, Vistaril 25 mg PRN anxiety, Added 5 Zyprexa zydis PRN TID agitation 3-Coping skills for depression, anxiety  4-Continue crisis stabilization and management  5-Address health issues--monitoring vital signs, stable  6-Treatment plan  in progress to prevent relapse of depression and anxiety  Lindwood Qua, NP [facility] 11/25/2016, 1:51 PM   Agree with NP Progress Note as above

## 2016-11-26 LAB — VALPROIC ACID LEVEL: VALPROIC ACID LVL: 34 ug/mL — AB (ref 50.0–100.0)

## 2016-11-26 MED ORDER — DIVALPROEX SODIUM 500 MG PO DR TAB
500.0000 mg | DELAYED_RELEASE_TABLET | Freq: Two times a day (BID) | ORAL | Status: DC
  Administered 2016-11-27: 500 mg via ORAL
  Filled 2016-11-26 (×3): qty 1

## 2016-11-26 MED ORDER — QUETIAPINE FUMARATE 50 MG PO TABS
150.0000 mg | ORAL_TABLET | Freq: Every day | ORAL | Status: DC
Start: 2016-11-26 — End: 2016-11-27
  Administered 2016-11-26: 150 mg via ORAL
  Filled 2016-11-26 (×2): qty 3

## 2016-11-26 NOTE — Plan of Care (Signed)
Problem: Education: Goal: Ability to state activities that reduce stress will improve Outcome: Progressing Nurse discussed anxiety/depression/coping skills with patient.    

## 2016-11-26 NOTE — Progress Notes (Signed)
[facility] MD Progress Note  11/26/2016 2:18 PM Gabriel Barnes  MRN:  [id number] Subjective:  Gabriel Barnes reports, "no changes today."  Objective:  Gabriel Barnes was admitted reporting symptoms of depression and substance abuse, Bipolar I Disorder.  He is pleasant.  He does report some irritability and  rumination at night that prevents him from falling asleep.     Principal Problem: Bipolar 1 disorder, mixed, moderate (HCC) Diagnosis:   Patient Active Problem List   Diagnosis Date Noted  . Alcohol use disorder, moderate, dependence (HCC) [F10.20] 11/22/2016  . Cannabis use disorder, severe, dependence (HCC) [F12.20] 11/22/2016  . 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 Substance abuse [F19.10] 03/09/2015  . Bipolar 1 disorder, mixed, moderate (HCC) [F31.62]   . Depression [F32.9] 03/08/2015   Total Time spent with patient: 30 minutes  Past Psychiatric History: see HPI  Past Medical History:  Past Medical History:  Diagnosis Date  . Acid reflux   . Anxiety   . Bipolar 1 disorder (HCC)   . Depression   . Polysubstance abuse    etoh, marijuana, molly, cocaine    Past Surgical History:  Procedure Laterality Date  . ANKLE SURGERY    . KNEE ARTHROPLASTY    . KNEE SURGERY     Family History:  Family History  Problem Relation Age of Onset  . Drug abuse Mother   . Drug abuse Father    Family Psychiatric  History: see HPI Social History:  History  Alcohol Use  . Yes    Comment: daily, bingwe drinks on the weekend     History  Drug Use  . Frequency: 1.0 time per week  . Types: Marijuana, Cocaine, MDMA (Ecstacy)    Comment: molly    Social History   Social History  . Marital status: Single    Spouse name: N/A  . Number of children: N/A  . Years of education: N/A   Social History Main Topics  . Smoking status: Never Smoker  . Smokeless tobacco: Never Used  . Alcohol use Yes     Comment: daily, bingwe drinks on the weekend  . Drug use:     Frequency: 1.0 time per week    Types:  Marijuana, Cocaine, MDMA (Ecstacy)     Comment: molly  . Sexual activity: Yes    Birth control/ protection: None   Other Topics Concern  . None   Social History Narrative  . None   Additional Social History:                         Sleep: Fair  Appetite:  Fair  Current Medications: Current Facility-Administered Medications  Medication Dose Route Frequency Provider Last Rate Last Dose  . alum & mag hydroxide-simeth (MAALOX/MYLANTA) 200-200-20 MG/5ML suspension 30 mL  30 mL Oral Q4H PRN Jackelyn Poling, NP   30 mL at 11/25/16 2003  . divalproex (DEPAKOTE) DR tablet 250 mg  250 mg Oral Q8H Saramma Eappen, MD   250 mg at 11/26/16 0624  . fluticasone (FLONASE) 50 MCG/ACT nasal spray 2 spray  2 spray Each Nare Daily Adonis Brook, NP   2 spray at 11/25/16 0807  . magnesium hydroxide (MILK OF MAGNESIA) suspension 30 mL  30 mL Oral Daily PRN Jackelyn Poling, NP      . naproxen (NAPROSYN) tablet 500 mg  500 mg Oral BID PRN Jackelyn Poling, NP   500 mg at 11/25/16 2107  . OLANZapine zydis (ZYPREXA) disintegrating  tablet 5 mg  5 mg Oral TID PRN Adonis Brook, NP   5 mg at 11/25/16 2107  . QUEtiapine (SEROQUEL) tablet 100 mg  100 mg Oral QHS Adonis Brook, NP   100 mg at 11/25/16 2107  . QUEtiapine (SEROQUEL) tablet 25 mg  25 mg Oral BID Adonis Brook, NP   25 mg at 11/26/16 8413    Lab Results:  Results for orders placed or performed during the hospital encounter of 11/21/16 (from the past 48 hour(s))  Valproic acid level     Status: Abnormal   Collection Time: 11/26/16  6:26 AM  Result Value Ref Range   Valproic Acid Lvl 34 (L) 50.0 - 100.0 ug/mL    Comment: Performed at [facility]    Blood Alcohol level:  Lab Results  Component Value Date   [facility] <5 11/21/2016   ETH <5 03/08/2015    Metabolic Disorder Labs: Lab Results  Component Value Date   HGBA1C 5.9 (H) 11/23/2016   MPG 123 11/23/2016   MPG 117 11/22/2016   Lab Results  Component Value Date    PROLACTIN 30.5 (H) 11/22/2016   Lab Results  Component Value Date   CHOL 175 11/22/2016   TRIG 165 (H) 11/22/2016   HDL 39 (L) 11/22/2016   CHOLHDL 4.5 11/22/2016   VLDL 33 11/22/2016   LDLCALC 103 (H) 11/22/2016    Physical Findings: AIMS: Facial and Oral Movements Muscles of Facial Expression: None, normal Lips and Perioral Area: None, normal Jaw: None, normal Tongue: None, normal,Extremity Movements Upper (arms, wrists, hands, fingers): None, normal Lower (legs, knees, ankles, toes): None, normal, Trunk Movements Neck, shoulders, hips: None, normal, Overall Severity Severity of abnormal movements (highest score from questions above): None, normal Incapacitation due to abnormal movements: None, normal Patient's awareness of abnormal movements (rate only patient's report): No Awareness, Dental Status Current problems with teeth and/or dentures?: No Does patient usually wear dentures?: No  CIWA:  CIWA-Ar Total: 1 COWS:  COWS Total Score: 2  Musculoskeletal: Strength & Muscle Tone: within normal limits Gait & Station: normal Patient leans: N/A  Psychiatric Specialty Exam: Physical Exam  Nursing note and vitals reviewed.   ROS  Blood pressure (!) 137/100, pulse 70, temperature 98 F (36.7 C), temperature source Oral, resp. rate 16, height 5\' 11"  (1.803 m), weight 122.5 kg (270 lb).Body mass index is 37.66 kg/m.  General Appearance: Neat  Eye Contact:  Fair  Speech:  Clear and Coherent  Volume:  Normal  Mood:  Anxious and Depressed  Affect:  Congruent  Thought Process:  Coherent   Orientation:  Full (Time, Place, and Person)  Thought Content:  Rumination  Suicidal Thoughts:  No  Homicidal Thoughts:  No  Memory:  Immediate;   Fair Recent;   Fair Remote;   Fair  Judgement:  Fair  Insight:  Fair  Psychomotor Activity:  Normal  Concentration:  Concentration: Fair and Attention Span: Fair  Recall:  [employer] of Knowledge:  Good  Language:  Good  Akathisia:   Negative  Handed:  Right  AIMS (if indicated):     Assets:  Desire for Improvement Resilience  ADL's:  Intact  Cognition:  WNL  Sleep:  Number of Hours: 6.75   Treatment Plan Summary: Review of chart, vital signs, medications, and notes.  1-Individual and group therapy  2-Medication management for depression and anxiety: Medications reviewed with the patient. Increase Seroquel 150 mg QHS and 25 mg BID mood stabilization, increase Depakote DR  500 mg Q12 mood stabilization, Ativan CIWA withdrawal, Vistaril 25 mg PRN anxiety, Added 5 Zyprexa zydis PRN TID agitation 3-Coping skills for depression, anxiety  4-Continue crisis stabilization and management  5-Address health issues--monitoring vital signs, stable  6-Treatment plan in progress to prevent relapse of depression and anxiety  Kynlea Blackston May Rheagan Nayak, NP [facility] 11/26/2016, 2:18 PM

## 2016-11-26 NOTE — [facility] Group Notes (Signed)
[facility] LCSW Group Therapy  11/26/2016 3:13 PM  Type of Therapy:  Group Therapy  Participation Level:  Did Not Attend-pt invited. Chose to go to room. "Alot of people make me nervous. I don't want to go. "   Summary of Progress/Problems: [facility] Speaker came to talk about his personal journey with substance abuse and addiction. The pt processed ways by which to relate to the speaker. [facility] speaker provided handouts and educational information pertaining to groups and services offered by the [facility].   Bennet Kujawa N Smart LCSW 11/26/2016, 3:13 PM

## 2016-11-26 NOTE — Progress Notes (Signed)
D:  Patient's self inventory sheet, patient has poor sleep, no sleep medication given.  Good appetite, normal energy level, good concentration.  Rated depression, hopeless and anxiety 5.  Denied withdrawals.  Denied SI.  Denied physical problems.  Physical pain, worst pain #5, ankle and leg.  Pain medication not helpful.  Goal is "myself and stop thinking so much.  Talk to someone."  Does have discharge plans.  Has problems with transportation. A:  Medications administered per MD orders.  Emotional support and encouragement given patient. R:  Denied SI and HI, contracts for safety.  Denied A/V hallucinations.  Safety maintained with 15 minute checks. Patient refused EKG today.  Went to hospital for tests and refused CT angio chest pe.  Patient refused groups today.

## 2016-11-26 NOTE — Progress Notes (Signed)
  DATA ACTION RESPONSE  Objective- Pt. is up and visible in the milieu, seen watching TV quietly. Pt. presents with a depressed/flat affect and mood. Pt. is making an effort this evening and is seen out in the dayroom and interacting approprietly. Subjective- Denies having any SI/HI/AVH at this time. Rates pain 8/10, R. ankle. Pt. states "I feel alright; a lot on mind ". Pt. continues to remain safe on the unit.  1:1 interaction in private to establish rapport. Encouragement, education, & support given from staff. Meds. ordered and administered. PRNs Maalox, Naproxen, and Zyprexa requested and will re-eval accordingly.   Safety maintained with Q 15 checks. Continues to follow treatment plan and will monitor closely. No additonal questions/concerns noted.

## 2016-11-26 NOTE — [facility] Group Notes (Signed)
The focus of this group is to educate the patient on the purpose and policies of crisis stabilization and provide a format to answer questions about their admission.  The group details unit policies and expectations of patients while admitted.  Patient did not attend 0900 nurse education orientation group this morning.  Patient stayed in bed.   

## 2016-11-26 NOTE — Plan of Care (Signed)
Problem: Medication: Goal: Compliance with prescribed medication regimen will improve Outcome: Progressing Pt is taking meds as prescribed.    

## 2016-11-26 NOTE — Progress Notes (Signed)
Pt attended karaoke group this evening.  

## 2016-11-26 NOTE — Progress Notes (Signed)
DATA ACTION RESPONSE  Objective- Pt. is up and visible in the milieu, seen watching TV and interacting with peers approprietly. Pt. presents with a depressed/anxious affect and mood. Pt. continues to improve in behavior. With encouragement from writer, Pt. attended karaoke this evening. Subjective- Denies having any SI/HI/AVH at this time. Rates pain 5/10, R. ankle. Pt. states [facility]". Pt. continues to remain safe on the unit. 1:1 interaction in private to establish rapport. Encouragement, education, & support given from staff. Meds. ordered and administered. PRN Naproxen requested and will re-eval accordingly.   Safety maintained with Q 15 checks. Continues to follow treatment plan and will monitor closely. No additonal questions/concerns noted.

## 2016-11-27 MED ORDER — QUETIAPINE FUMARATE 200 MG PO TABS
200.0000 mg | ORAL_TABLET | Freq: Every day | ORAL | Status: DC
  Administered 2016-11-27 – 2016-11-29 (×3): 200 mg via ORAL
  Filled 2016-11-27: qty 7
  Filled 2016-11-27 (×4): qty 1

## 2016-11-27 MED ORDER — DIVALPROEX SODIUM ER 500 MG PO TB24
1000.0000 mg | ORAL_TABLET | Freq: Every day | ORAL | Status: DC
  Administered 2016-11-27 – 2016-11-29 (×3): 1000 mg via ORAL
  Filled 2016-11-27: qty 14
  Filled 2016-11-27 (×5): qty 2

## 2016-11-27 NOTE — [facility] Group Notes (Signed)
[facility] LCSW Group Therapy  11/27/2016 3:58 PM  Type of Therapy:  Group Therapy  Participation Level:  Minimal  Participation Quality:  Attentive  Affect:  Depressed and Flat  Cognitive:  Oriented  Insight:  Improving  Engagement in Therapy:  Limited  Modes of Intervention:  Discussion, Education, Exploration, Problem-solving, Socialization and Support  Summary of Progress/Problems: Feelings around Relapse. Group members discussed the meaning of relapse and shared personal stories of relapse, how it affected them and others, and how they perceived themselves during this time. Group members were encouraged to identify triggers, warning signs and coping skills used when facing the possibility of relapse. Social supports were discussed and explored in detail. Post Acute Withdrawal Syndrome (handout provided) was introduced and examined. Pt's were encouraged to ask questions, talk about key points associated with PAWS, and process this information in terms of relapse prevention.   Ritaj Dullea N Smart LCSW 11/27/2016, 3:58 PM

## 2016-11-27 NOTE — Progress Notes (Signed)
Data. Patient denies SI/HI/AVH. Patient interacting well with staff and other patients. Reports poor sleep. Affect is blunt, but brightens with interactions. Did not complete a self assessment this shift. Action. Emotional support and encouragement offered. Education provided on medication, indications and side effect. Q 15 minute checks done for safety. Response. Safety on the unit maintained through 15 minute checks.  Medications taken as prescribed. Remained calm and appropriate through out shift.

## 2016-11-27 NOTE — [facility] Suicide Risk Assessment (Signed)
[facility] INPATIENT:  Family/Significant Other Suicide Prevention Education  Suicide Prevention Education:  Education Completed; Gabriel Barnes (pt's wife) [phone] has been identified by the patient as the family member/significant other with whom the patient will be residing, and identified as the person(s) who will aid the patient in the event of a mental health crisis (suicidal ideations/suicide attempt).  With written consent from the patient, the family member/significant other has been provided the following suicide prevention education, prior to the and/or following the discharge of the patient.  The suicide prevention education provided includes the following:  Suicide risk factors  Suicide prevention and interventions  National Suicide Hotline telephone number  [facility] assessment telephone number  [facility] Emergency Assistance 911  [facility] and/or Residential Mobile Crisis Unit telephone number  Request made of family/significant other to:  Remove weapons (e.g., guns, rifles, knives), all items previously/currently identified as safety concern.    Remove drugs/medications (over-the-counter, prescriptions, illicit drugs), all items previously/currently identified as a safety concern.  The family member/significant other verbalizes understanding of the suicide prevention education information provided.  The family member/significant other agrees to remove the items of safety concern listed above.  Gabriel Lumadue N Smart LCSW 11/27/2016, 3:58 PM

## 2016-11-27 NOTE — Tx Team (Signed)
Interdisciplinary Treatment and Diagnostic Plan Update  11/27/2016 Time of Session: 9:30AM Gabriel Barnes MRN: [id number]  Principal Diagnosis: Bipolar 1 disorder, mixed, moderate (HCC)  Secondary Diagnoses: Principal Problem:   Bipolar 1 disorder, mixed, moderate (HCC) Active Problems:   Alcohol use disorder, moderate, dependence (HCC)   Cannabis use disorder, severe, dependence (HCC)   MDMA abuse   Current Medications:  Current Facility-Administered Medications  Medication Dose Route Frequency Provider Last Rate Last Dose  . alum & mag hydroxide-simeth (MAALOX/MYLANTA) 200-200-20 MG/5ML suspension 30 mL  30 mL Oral Q4H PRN Jackelyn Poling, NP   30 mL at 11/26/16 1541  . divalproex (DEPAKOTE) DR tablet 500 mg  500 mg Oral Q12H Adonis Brook, NP   500 mg at 11/27/16 7829  . fluticasone (FLONASE) 50 MCG/ACT nasal spray 2 spray  2 spray Each Nare Daily Adonis Brook, NP   2 spray at 11/25/16 0807  . magnesium hydroxide (MILK OF MAGNESIA) suspension 30 mL  30 mL Oral Daily PRN Jackelyn Poling, NP      . naproxen (NAPROSYN) tablet 500 mg  500 mg Oral BID PRN Jackelyn Poling, NP   500 mg at 11/26/16 2136  . OLANZapine zydis (ZYPREXA) disintegrating tablet 5 mg  5 mg Oral TID PRN Adonis Brook, NP   5 mg at 11/26/16 1544  . QUEtiapine (SEROQUEL) tablet 150 mg  150 mg Oral QHS Adonis Brook, NP   150 mg at 11/26/16 2136  . QUEtiapine (SEROQUEL) tablet 25 mg  25 mg Oral BID Adonis Brook, NP   25 mg at 11/27/16 5621   PTA Medications: Prescriptions Prior to Admission  Medication Sig Dispense Refill Last Dose  . divalproex (DEPAKOTE ER) 500 MG 24 hr tablet Take 1 tablet (500 mg total) by mouth 2 (two) times daily. For mood stabilization (Patient not taking: Reported on 11/21/2016) 60 tablet 0 Not Taking at Unknown time  . hydrOXYzine (ATARAX/VISTARIL) 25 MG tablet Take 25 mg by mouth 3 (three) times daily as needed for anxiety.   Not Taking at Unknown time  . QUEtiapine (SEROQUEL) 200 MG  tablet Take 1 tablet (200 mg total) by mouth at bedtime. For mood contol (Patient not taking: Reported on 11/21/2016) 30 tablet 0 Not Taking at Unknown time  . QUEtiapine (SEROQUEL) 50 MG tablet Take 1 tablet (50 mg total) by mouth 2 (two) times daily. For mood control/depression (Patient not taking: Reported on 11/21/2016) 60 tablet 0 Not Taking at Unknown time  . traZODone (DESYREL) 50 MG tablet Take 1 tablet (50 mg total) by mouth at bedtime as needed for sleep. (Patient not taking: Reported on 11/21/2016) 30 tablet 0 Not Taking at Unknown time    Patient Stressors: Financial difficulties Health problems Medication change or noncompliance Substance abuse  Patient Strengths: Active sense of humor General fund of knowledge Motivation for treatment/growth Supportive family/friends  Treatment Modalities: Medication Management, Group therapy, Case management,  1 to 1 session with clinician, Psychoeducation, Recreational therapy.   Physician Treatment Plan for Primary Diagnosis: Bipolar 1 disorder, mixed, moderate (HCC) Long Term Goal(s): Improvement in symptoms so as ready for discharge Improvement in symptoms so as ready for discharge   Short Term Goals: Ability to identify changes in lifestyle to reduce recurrence of condition will improve Ability to verbalize feelings will improve Ability to disclose and discuss suicidal ideas Ability to demonstrate self-control will improve Ability to identify and develop effective coping behaviors will improve Ability to maintain clinical measurements within normal limits will  improve Compliance with prescribed medications will improve Ability to identify triggers associated with substance abuse/mental health issues will improve Ability to identify changes in lifestyle to reduce recurrence of condition will improve Ability to verbalize feelings will improve Ability to disclose and discuss suicidal ideas Ability to demonstrate self-control will  improve Ability to identify and develop effective coping behaviors will improve Ability to maintain clinical measurements within normal limits will improve Compliance with prescribed medications will improve Ability to identify triggers associated with substance abuse/mental health issues will improve  Medication Management: Evaluate patient's response, side effects, and tolerance of medication regimen.  Therapeutic Interventions: 1 to 1 sessions, Unit Group sessions and Medication administration.  Evaluation of Outcomes: Progressing  Physician Treatment Plan for Secondary Diagnosis: Principal Problem:   Bipolar 1 disorder, mixed, moderate (HCC) Active Problems:   Alcohol use disorder, moderate, dependence (HCC)   Cannabis use disorder, severe, dependence (HCC)   MDMA abuse  Long Term Goal(s): Improvement in symptoms so as ready for discharge Improvement in symptoms so as ready for discharge   Short Term Goals: Ability to identify changes in lifestyle to reduce recurrence of condition will improve Ability to verbalize feelings will improve Ability to disclose and discuss suicidal ideas Ability to demonstrate self-control will improve Ability to identify and develop effective coping behaviors will improve Ability to maintain clinical measurements within normal limits will improve Compliance with prescribed medications will improve Ability to identify triggers associated with substance abuse/mental health issues will improve Ability to identify changes in lifestyle to reduce recurrence of condition will improve Ability to verbalize feelings will improve Ability to disclose and discuss suicidal ideas Ability to demonstrate self-control will improve Ability to identify and develop effective coping behaviors will improve Ability to maintain clinical measurements within normal limits will improve Compliance with prescribed medications will improve Ability to identify triggers  associated with substance abuse/mental health issues will improve     Medication Management: Evaluate patient's response, side effects, and tolerance of medication regimen.  Therapeutic Interventions: 1 to 1 sessions, Unit Group sessions and Medication administration.  Evaluation of Outcomes: Progressing   RN Treatment Plan for Primary Diagnosis: Bipolar 1 disorder, mixed, moderate (HCC) Long Term Goal(s): Knowledge of disease and therapeutic regimen to maintain health will improve  Short Term Goals: Ability to remain free from injury will improve, Ability to disclose and discuss suicidal ideas and Ability to identify and develop effective coping behaviors will improve  Medication Management: RN will administer medications as ordered by provider, will assess and evaluate patient's response and provide education to patient for prescribed medication. RN will report any adverse and/or side effects to prescribing provider.  Therapeutic Interventions: 1 on 1 counseling sessions, Psychoeducation, Medication administration, Evaluate responses to treatment, Monitor vital signs and CBGs as ordered, Perform/monitor CIWA, COWS, AIMS and Fall Risk screenings as ordered, Perform wound care treatments as ordered.  Evaluation of Outcomes: Progressing   LCSW Treatment Plan for Primary Diagnosis: Bipolar 1 disorder, mixed, moderate (HCC) Long Term Goal(s): Safe transition to appropriate next level of care at discharge, Engage patient in therapeutic group addressing interpersonal concerns.  Short Term Goals: Engage patient in aftercare planning with referrals and resources, Facilitate patient progression through stages of change regarding substance use diagnoses and concerns and Identify triggers associated with mental health/substance abuse issues  Therapeutic Interventions: Assess for all discharge needs, 1 to 1 time with Social worker, Explore available resources and support systems, Assess for adequacy  in community support network, Educate family and significant  other(s) on suicide prevention, Complete Psychosocial Assessment, Interpersonal group therapy.  Evaluation of Outcomes: Progressing   Progress in Treatment: Attending groups: Yes. Participating in groups: Yes. Intermittently  Taking medication as prescribed: Yes. Toleration medication: Yes. Family/Significant other contact made: pt gave permission for CSW to contact his wife. Contact attempt made/not able to reach her at this time.  Patient understands diagnosis: Yes. Discussing patient identified problems/goals with staff: Yes. Medical problems stabilized or resolved: Yes. Denies suicidal/homicidal ideation: Yes. Issues/concerns per patient self-inventory: No. Other: n/a  New problem(s) identified: No, Describe:  n/a  New Short Term/Long Term Goal(s): detox; medication stabilization; development of comprehensive mental wellness/sobriety plan.   Discharge Plan or Barriers: CSW assessing-pt interested in [facility] for follow-up and in [facility].   Reason for Continuation of Hospitalization: Anxiety Depression Medication stabilization  Estimated Length of Stay: 1-3 days   Attendees: Patient: 11/27/2016 10:06 AM  Physician: Dr. Elna Breslow MD; Dr. Jama Flavors MD 11/27/2016 10:06 AM  Nursing: Boyd Kerbs RN; Devin Going T. RN 11/27/2016 10:06 AM  RN Care Manager: Onnie Boer CM 11/27/2016 10:06 AM  Social Worker: Herbert Seta Smart, LCSW; Vernie Shanks NP 11/27/2016 10:06 AM  Recreational Therapist:  11/27/2016 10:06 AM  Other: Armandina Stammer NP 11/27/2016 10:06 AM  Other:  11/27/2016 10:06 AM  Other: 11/27/2016 10:06 AM    Scribe for Treatment Team: Ledell Peoples Smart, LCSW 11/27/2016 10:06 AM

## 2016-11-27 NOTE — Progress Notes (Signed)
[facility] MD Progress Note  11/27/2016 3:10 PM Gabriel Barnes  MRN:  [id number] Subjective:  At this time patient reports partial improvement compared to admission- states that he continues to have some withdrawal symptoms,feels " sweaty". Denies medication side effects.  Objective:  I have discussed case with treatment team and have met with patient .  Patient reports partial improvement of symptoms, but reports residual dysphoria, vague irritability  and  some residual withdrawal symptoms. Vitals stable, no tremors,does not appear to be in any   acute distress As discussed with staff, CSW, patient has remained irritable, somewhat isolative, but improving gradually, and today more visible on unit. Has expressed interest in ongoing treatment and the current disposition plan is referral to [facility] on discharge.  Denies medication side effects. Currently on Depakote and Seroquel. Wants to D/C daytime Seroquel to minimize sedation - side effects reviewed  Valproic Acid Serum level sub-therapeutic- 34   Principal Problem: Bipolar 1 disorder, mixed, moderate (HCC) Diagnosis:   Patient Active Problem List   Diagnosis Date Noted  . Alcohol use disorder, moderate, dependence ([place]) [F10.20] 11/22/2016  . Cannabis use disorder, severe, dependence ([place]) [F12.20] 11/22/2016  . 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 Substance abuse [F19.10] 03/09/2015  . Bipolar 1 disorder, mixed, moderate ([place]) [F31.62]   . Depression [F32.9] 03/08/2015   Total Time spent with patient: 20 minutes   Past Psychiatric History: see HPI  Past Medical History:  Past Medical History:  Diagnosis Date  . Acid reflux   . Anxiety   . Bipolar 1 disorder ([place])   . Depression   . Polysubstance abuse    etoh, marijuana, molly, cocaine    Past Surgical History:  Procedure Laterality Date  . ANKLE SURGERY    . KNEE ARTHROPLASTY    . KNEE SURGERY     Family History:  Family History  Problem Relation Age of Onset  . Drug abuse  Mother   . Drug abuse Father    Family Psychiatric  History: see HPI Social History:  History  Alcohol Use  . Yes    Comment: daily, bingwe drinks on the weekend     History  Drug Use  . Frequency: 1.0 time per week  . Types: Marijuana, Cocaine, MDMA (Ecstacy)    Comment: molly    Social History   Social History  . Marital status: Single    Spouse name: N/A  . Number of children: N/A  . Years of education: N/A   Social History Main Topics  . Smoking status: Never Smoker  . Smokeless tobacco: Never Used  . Alcohol use Yes     Comment: daily, bingwe drinks on the weekend  . Drug use:     Frequency: 1.0 time per week    Types: Marijuana, Cocaine, MDMA (Ecstacy)     Comment: molly  . Sexual activity: Yes    Birth control/ protection: None   Other Topics Concern  . None   Social History Narrative  . None   Additional Social History:   Sleep: improved  Appetite:  Fair  Current Medications: Current Facility-Administered Medications  Medication Dose Route Frequency Provider Last Rate Last Dose  . alum & mag hydroxide-simeth (MAALOX/MYLANTA) 200-200-20 MG/5ML suspension 30 mL  30 mL Oral Q4H PRN Rozetta Nunnery, NP   30 mL at 11/26/16 1541  . divalproex (DEPAKOTE ER) 24 hr tablet 1,000 mg  1,000 mg Oral QHS Jenne Campus, MD      . fluticasone Asencion Islam)  50 MCG/ACT nasal spray 2 spray  2 spray Each Nare Daily Kerrie Buffalo, NP   2 spray at 11/25/16 0807  . magnesium hydroxide (MILK OF MAGNESIA) suspension 30 mL  30 mL Oral Daily PRN Rozetta Nunnery, NP      . naproxen (NAPROSYN) tablet 500 mg  500 mg Oral BID PRN Rozetta Nunnery, NP   500 mg at 11/26/16 2136  . OLANZapine zydis (ZYPREXA) disintegrating tablet 5 mg  5 mg Oral TID PRN Kerrie Buffalo, NP   5 mg at 11/26/16 1544  . QUEtiapine (SEROQUEL) tablet 200 mg  200 mg Oral QHS Jenne Campus, MD        Lab Results:  Results for orders placed or performed during the hospital encounter of 11/21/16 (from the past 48  hour(s))  Valproic acid level     Status: Abnormal   Collection Time: 11/26/16  6:26 AM  Result Value Ref Range   Valproic Acid Lvl 34 (L) 50.0 - 100.0 ug/mL    Comment: Performed at [facility]    Blood Alcohol level:  Lab Results  Component Value Date   [facility] <5 11/21/2016   ETH <5 25/85/2778    Metabolic Disorder Labs: Lab Results  Component Value Date   HGBA1C 5.9 (H) 11/23/2016   MPG 123 11/23/2016   MPG 117 11/22/2016   Lab Results  Component Value Date   PROLACTIN 30.5 (H) 11/22/2016   Lab Results  Component Value Date   CHOL 175 11/22/2016   TRIG 165 (H) 11/22/2016   HDL 39 (L) 11/22/2016   CHOLHDL 4.5 11/22/2016   VLDL 33 11/22/2016   LDLCALC 103 (H) 11/22/2016    Physical Findings: AIMS: Facial and Oral Movements Muscles of Facial Expression: None, normal Lips and Perioral Area: None, normal Jaw: None, normal Tongue: None, normal,Extremity Movements Upper (arms, wrists, hands, fingers): None, normal Lower (legs, knees, ankles, toes): None, normal, Trunk Movements Neck, shoulders, hips: None, normal, Overall Severity Severity of abnormal movements (highest score from questions above): None, normal Incapacitation due to abnormal movements: None, normal Patient's awareness of abnormal movements (rate only patient's report): No Awareness, Dental Status Current problems with teeth and/or dentures?: No Does patient usually wear dentures?: No  CIWA:  CIWA-Ar Total: 5 COWS:  COWS Total Score: 2  Musculoskeletal: Strength & Muscle Tone: within normal limits Gait & Station: normal Patient leans: N/A  Psychiatric Specialty Exam: Physical Exam  Nursing note and vitals reviewed.   ROS no chest pain, no shortness of breath, no vomiting  Blood pressure 132/79, pulse 91, temperature 98.1 F (36.7 C), temperature source Oral, resp. rate 16, height 5' 11"  (1.803 m), weight 122.5 kg (270 lb).Body mass index is 37.66 kg/m.  General Appearance:  improved grooming   Eye Contact: good   Speech:  Normal   Volume:  Normal  Mood:  Dysphoric and but improving   Affect:  mildly constricted and irritable, improved as session progressed   Thought Process:  Coherent and Linear   Orientation:  Full (Time, Place, and Person)  Thought Content:  Denies hallucinations, no delusions expressed   Suicidal Thoughts:  No denies any current suicidal or self injurious ideations , denies any homicidal ideations  Homicidal Thoughts:  No  Memory:  Recent and remote grossly intact   Judgement: improving  Insight:  improving  Psychomotor Activity:  Normal  Concentration:  Concentration: Good and Attention Span: Good  Recall:  Good  Fund of Knowledge:  Good  Language:  Good  Akathisia:  Negative  Handed:  Right  AIMS (if indicated):     Assets:  Desire for Improvement Resilience  ADL's:  Intact  Cognition:  WNL  Sleep:  Number of Hours: 6.25   Assessment - patient presents with partial improvement compared to admission - remains vaguely depressed, irritable, but is more visible on unit, and starting to focus more on discharge planning . Tolerating Seroquel and Depakote well, Valproic Acid serum was sub therapeutic.  Treatment Plan Summary: Encourage increased group and milieu participation to work on coping skills and symptom reduction Encourage efforts to work on sobriety and relapse prevention  Change Seroquel to 200 mgrs QHS ( D/C daytime dose ) for mood disorder Increase/change Depakote to Depakote ER 1000 mgrs QHS for mood disorder  Treatment team working on disposition planning   Neita Garnet, MD   11/27/2016, 3:10 PM   Patient ID: Gabriel Barnes, male   DOB: [birth year], 36 y.o.   MRN: [id number]

## 2016-11-27 NOTE — Progress Notes (Signed)
Recreation Therapy Notes  Date: 11/27/16 Time: 0930 Location: [address] Dayroom  Group Topic: Stress Management  Goal Area(s) Addresses:  Patient will verbalize importance of using healthy stress management.  Patient will identify positive emotions associated with healthy stress management.   Intervention:  Stress Management  Activity :  Forest Visualization.  LRT introduced the stress management technique of guided imagery to patients.  LRT read a script to guide patients through the technique so they could engage in the process.  Patients were to follow along as LRT read script.  Education:  Stress Management, Discharge Planning.   Education Outcome: Acknowledges edcuation/In group clarification offered/Needs additional education  Clinical Observations/Feedback:  Pt did not attend group.     Ayjah Show, LRT/CTRS         Tyon Cerasoli A 11/27/2016 11:30 AM 

## 2016-11-27 NOTE — Plan of Care (Signed)
Problem: Medication: Goal: Compliance with prescribed medication regimen will improve Outcome: Progressing Patient is taking his medications as prescribed.

## 2016-11-28 DIAGNOSIS — F319 Bipolar disorder, unspecified: Secondary | ICD-10-CM

## 2016-11-28 MED ORDER — TRAZODONE HCL 100 MG PO TABS
100.0000 mg | ORAL_TABLET | Freq: Every evening | ORAL | Status: DC | PRN
  Administered 2016-11-28: 100 mg via ORAL
  Filled 2016-11-28 (×2): qty 1

## 2016-11-28 MED ORDER — IBUPROFEN 600 MG PO TABS
600.0000 mg | ORAL_TABLET | Freq: Four times a day (QID) | ORAL | Status: DC | PRN
  Administered 2016-11-28 – 2016-11-29 (×2): 600 mg via ORAL
  Filled 2016-11-28 (×4): qty 1

## 2016-11-28 NOTE — [facility] Group Notes (Signed)
  [facility] LCSW Group Therapy Note  Date and  10:00 to 11:10 AM  Type of Therapy and Topic:  Group Therapy: Avoiding Self-Sabotaging and Enabling Behaviors  Participation Level:  Minimal  Participation Quality:  Inattentive  Affect:  Anxious and Irritable  Cognitive:  Oriented  Insight:  None shared  Engagement in Therapy:  None noted   Therapeutic models used: Cognitive Behavioral Therapy,  Person-Centered Therapy and Motivational Interviewing  Modes of Intervention:  Discussion, Exploration, Orientation, Rapport Building, Socialization and Support   Summary of Progress/Problems:  The main focus of today's process group was for the patient to identify ways in which they have in the past sabotaged their own recovery.Patients discussed pros and cons of multiple self sabotaging methods including: Isolation, Negative Peer Group, Substance Use, Negative Self Talk, Rumination, Shame & Guilt and Noncompliance with Follow up.  Motivational Interviewing was utilized to identify motivation they may have for wanting to change. Patient was not engaged and appeared to be struggling with physical symptoms before leaving group to return only briefly.   Carney Bernatherine C Delando Satter, LCSW

## 2016-11-28 NOTE — Progress Notes (Addendum)
[facility] Face to Face Progress Note  11/28/2016 11:42 AM VERYL WINEMILLER  MRN:  [id number]   On evaluation:   When seen today, Pt was at the nurses station and dressed casually. Pt stated his mood is good today but he slept poorly last night. Pt is requesting a CT scan of his head due to stabbing, wave like pain that comes and goes in his temple area on both sides. Pt stated his current mood as "laid-back and going with the flow." Pt stated his appetite is good.    Principal Problem: Bipolar 1 disorder, mixed, moderate (HCC) Diagnosis:   Patient Active Problem List   Diagnosis Date Noted  . Alcohol use disorder, moderate, dependence (HCC) [F10.20] 11/22/2016  . Cannabis use disorder, severe, dependence (HCC) [F12.20] 11/22/2016  . 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 Substance abuse [F19.10] 03/09/2015  . Bipolar 1 disorder, mixed, moderate (HCC) [F31.62]   . Depression [F32.9] 03/08/2015   Total Time spent with patient: 15 minutes  Past Psychiatric History: see HPI  Past Medical History:  Past Medical History:  Diagnosis Date  . Acid reflux   . Anxiety   . Bipolar 1 disorder (HCC)   . Depression   . Polysubstance abuse    etoh, marijuana, molly, cocaine    Past Surgical History:  Procedure Laterality Date  . ANKLE SURGERY    . KNEE ARTHROPLASTY    . KNEE SURGERY     Family History:  Family History  Problem Relation Age of Onset  . Drug abuse Mother   . Drug abuse Father    Family Psychiatric  History: see HPI Social History:  History  Alcohol Use  . Yes    Comment: daily, bingwe drinks on the weekend     History  Drug Use  . Frequency: 1.0 time per week  . Types: Marijuana, Cocaine, MDMA (Ecstacy)    Comment: molly    Social History   Social History  . Marital status: Single    Spouse name: N/A  . Number of children: N/A  . Years of education: N/A   Social History Main Topics  . Smoking status: Never Smoker  . Smokeless tobacco: Never Used  . Alcohol  use Yes     Comment: daily, bingwe drinks on the weekend  . Drug use:     Frequency: 1.0 time per week    Types: Marijuana, Cocaine, MDMA (Ecstacy)     Comment: molly  . Sexual activity: Yes    Birth control/ protection: None   Other Topics Concern  . None   Social History Narrative  . None   Additional Social History:    Sleep: Poor  Appetite:  Good  Current Medications: Current Facility-Administered Medications  Medication Dose Route Frequency Provider Last Rate Last Dose  . alum & mag hydroxide-simeth (MAALOX/MYLANTA) 200-200-20 MG/5ML suspension 30 mL  30 mL Oral Q4H PRN Jackelyn Poling, NP   30 mL at 11/27/16 1553  . divalproex (DEPAKOTE ER) 24 hr tablet 1,000 mg  1,000 mg Oral QHS Craige Cotta, MD   1,000 mg at 11/27/16 2125  . fluticasone (FLONASE) 50 MCG/ACT nasal spray 2 spray  2 spray Each Nare Daily Adonis Brook, NP   2 spray at 11/28/16 1022  . magnesium hydroxide (MILK OF MAGNESIA) suspension 30 mL  30 mL Oral Daily PRN Jackelyn Poling, NP   30 mL at 11/27/16 2041  . naproxen (NAPROSYN) tablet 500 mg  500 mg Oral BID PRN  Jackelyn PolingJason A Berry, NP   500 mg at 11/28/16 1021  . QUEtiapine (SEROQUEL) tablet 200 mg  200 mg Oral QHS Craige CottaFernando A Cobos, MD   200 mg at 11/27/16 2124    Lab Results:  No results found for this or any previous visit (from the past 48 hour(s)).  Blood Alcohol level:  Lab Results  Component [facility]   [facility]   ETH <5 03/08/2015    Metabolic Disorder Labs: Lab Results  Component Value Date   HGBA1C 5.9 (H) 11/23/2016   MPG 123 11/23/2016   MPG 117 11/22/2016   Lab Results  Component Value Date   PROLACTIN 30.5 (H) 11/22/2016   Lab Results  Component Value Date   CHOL 175 11/22/2016   TRIG 165 (H) 11/22/2016   HDL 39 (L) 11/22/2016   CHOLHDL 4.5 11/22/2016   VLDL 33 11/22/2016   LDLCALC 103 (H) 11/22/2016    Physical Findings: AIMS: Facial and Oral Movements Muscles of Facial Expression: None, normal Lips and  Perioral Area: None, normal Jaw: None, normal Tongue: None, normal,Extremity Movements Upper (arms, wrists, hands, fingers): None, normal Lower (legs, knees, ankles, toes): None, normal, Trunk Movements Neck, shoulders, hips: None, normal, Overall Severity Severity of abnormal movements (highest score from questions above): None, normal Incapacitation due to abnormal movements: None, normal Patient's awareness of abnormal movements (rate only patient's report): No Awareness, Dental Status Current problems with teeth and/or dentures?: No Does patient usually wear dentures?: No  CIWA:  CIWA-Ar Total: 2 COWS:  COWS Total Score: 2  Musculoskeletal: Strength & Muscle Tone: within normal limits Gait & Station: normal Patient leans: N/A  Psychiatric Specialty Exam: Physical Exam  Nursing note and vitals reviewed.   ROS  Blood pressure 119/76, pulse (!) 106, temperature 97.7 F (36.5 C), resp. rate 18, height 5\' 11"  (1.803 m), weight 122.5 kg (270 lb).Body mass index is 37.66 kg/m.  General Appearance: Casual and Neat  Eye Contact:  Good  Speech:  Clear and Coherent  Volume:  Normal  Mood:  Depressed  Affect:  Congruent  Thought Process:  Coherent   Orientation:  Full (Time, Place, and Person)  Thought Content:  Rumination, concerned about head pain he is having  Suicidal Thoughts:  No  Homicidal Thoughts:  No  Memory:  Immediate;   Fair Recent;   Fair Remote;   Fair  Judgement:  Fair  Insight:  Fair  Psychomotor Activity:  Normal  Concentration:  Concentration: Fair and Attention Span: Fair  Recall:  [employer]Fair  Fund of Knowledge:  Good  Language:  Good  Akathisia:  Negative  Handed:  Right  AIMS (if indicated):     Assets:  Communication Skills Desire for Improvement Financial Resources/Insurance Resilience Social Support  ADL's:  Intact  Cognition:  WNL  Sleep:  Number of Hours: 6   Treatment Plan Summary: Review of chart, vital signs, medications, and notes.   1-Individual and group therapy  2-Medication management for depression and anxiety: Medications reviewed with the patient.   Seroquel 200 mg QHS mood stabilization  Depakote DR 1,000 mg QHS mood stabilization,  Vistaril 25 mg PRN anxiety  Added Ibuprofen 600 mg Q6h PRN for mild/moderate pain 3-Coping skills for depression, anxiety  4-Continue crisis stabilization and management  5-Address health issues--monitoring vital signs, stable  6-Address concerns regarding head pain 7-Treatment plan in progress to prevent relapse of depression and anxiety  Laveda AbbeLaurie Britton Neah Sporrer, NP-BC 11/28/2016, 11:42 AM  Agree with notes and plan

## 2016-11-28 NOTE — Progress Notes (Signed)
Data. Patient denies SI/HI/AVH. Patient interacting well with staff and other patients. Patient has been complaining of a bilateral headache going from his temples to the back of his head. He reports this happens off and on and has started about two to three weeks ago. NP notified. Patient did not complete a self assessment this shift. Action. Emotional support and encouragement offered. Education provided on medication, indications and side effect. Q 15 minute checks done for safety. Response. Safety on the unit maintained through 15 minute checks.  Medications taken as prescribed. Attended groups. Remained calm and appropriate through out shift.

## 2016-11-28 NOTE — Plan of Care (Signed)
Problem: Activity: Goal: Interest or engagement in leisure activities will improve Outcome: Progressing Patient has attended groups through out the shift.

## 2016-11-28 NOTE — Progress Notes (Signed)
D.  Pt pleasant on approach, complaint of insomnia.  Pt states that he barely slept last night and requested additional medication tonight.  Pt did attend evening AA group, observed interacting appropriately with peers on the unit.  Pt denies SI/HI/hallucinations at this time.  A.  Support and encouragement offered, medication given as ordered.  NP notified of request for medication for insomnia and orders received.  R.  Pt remains safe on the unit, will continue to monitor.

## 2016-11-28 NOTE — Progress Notes (Signed)
The patient attended this evening's A. A. Meeting and was appropriate.  

## 2016-11-28 NOTE — [facility] Group Notes (Signed)
Nurse PsychoEducational Group and Goals review: Patient attended and actively participated, stating that hehad no goal until group, no it is to walk some after eating.

## 2016-11-28 NOTE — Progress Notes (Signed)
Patient ID: Gabriel Barnes, male   DOB: Apr 26, 1981, 36 y.o.   MRN: [id number]  Pt currently presents with a flat affect and restless behavior. Pt reports to writer that their goal is to "feel better." Pt states "I have been having a lot of heartburn today." Pt reports good sleep with current medication regimen. Pt reports increased anxiety throughout the day. Concerned about trending hypertension.   Pt provided with medications per providers orders. Pt's labs and vitals were monitored throughout the night. Pt supported emotionally and encouraged to express concerns and questions. Pt educated on medications, hypertension and alternative stress reducing techniques.  Pt's safety ensured with 15 minute and environmental checks. Pt currently denies SI/HI and A/V hallucinations. Pt verbally agrees to seek staff if SI/HI or A/VH occurs and to consult with staff before acting on any harmful thoughts. Will continue POC.

## 2016-11-29 MED ORDER — HYDROXYZINE HCL 25 MG PO TABS
25.0000 mg | ORAL_TABLET | Freq: Four times a day (QID) | ORAL | Status: DC | PRN
  Administered 2016-11-29 – 2016-11-30 (×3): 25 mg via ORAL
  Filled 2016-11-29: qty 1
  Filled 2016-11-29: qty 10
  Filled 2016-11-29 (×2): qty 1

## 2016-11-29 MED ORDER — TRAZODONE HCL 100 MG PO TABS
100.0000 mg | ORAL_TABLET | Freq: Every evening | ORAL | Status: DC | PRN
  Administered 2016-11-29 (×2): 100 mg via ORAL
  Filled 2016-11-29 (×3): qty 1
  Filled 2016-11-29 (×3): qty 14
  Filled 2016-11-29 (×2): qty 1

## 2016-11-29 MED ORDER — KETOROLAC TROMETHAMINE 60 MG/2ML IM SOLN
60.0000 mg | Freq: Once | INTRAMUSCULAR | Status: AC
  Administered 2016-11-29: 60 mg via INTRAMUSCULAR
  Filled 2016-11-29 (×2): qty 2

## 2016-11-29 NOTE — Progress Notes (Signed)
Data. Patient denies SI/HI/AVH. Got up late this morning. Patient interacting well with staff and other patients.Affect is brighter this shift. Continues to complain of heartburn. His ankles are somewhat swollen and painful, with no pitting. Patient states it is from the surgery on his ankle. On his self assessment patient reports 4/10 for depression, 5/10 for hopelessness and anxiety. His goal for today is: "To tell myself I am somebody".  Action. Emotional support and encouragement offered. Education provided on medication, indications and side effect. Q 15 minute checks done for safety. Response. Safety on the unit maintained through 15 minute checks.  Medications taken as prescribed. Attended groups. Remained calm and appropriate through out shift.

## 2016-11-29 NOTE — Progress Notes (Signed)
[facility] Face to Face Progress Note  11/29/2016 3:07 PM Gabriel Barnes  MRN:  [id number]  Subjective: Gabriel Barnes reports, "I'm doing great. However, complains of headache pains which he says is not as bad today. He is looking forward to getting discharged & doing right from here on".   Objective:  Gabriel Barnes is seen, chart reviewed. He was admitted reporting symptoms of depression and substance abuse, Bipolar I Disorder. Today, he states that his mood is good. Looking forward to getting discharged, determined to do better from here on. Other than some headache pains, he denies any issues. Does not appear to be responding to any internal stimuli. Denies any medication side effects. Denies any SIHI, AVH. Visible on the unit, cooperative, participating in group sessions.   Principal Problem: Bipolar 1 disorder, mixed, moderate (HCC) Diagnosis:   Patient Active Problem List   Diagnosis Date Noted  . Alcohol use disorder, moderate, dependence (HCC) [F10.20] 11/22/2016  . Cannabis use disorder, severe, dependence (HCC) [F12.20] 11/22/2016  . 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 Substance abuse [F19.10] 03/09/2015  . Bipolar 1 disorder, mixed, moderate (HCC) [F31.62]   . Depression [F32.9] 03/08/2015   Total Time spent with patient: 15 minutes  Past Psychiatric History: See HPI  Past Medical History:  Past Medical History:  Diagnosis Date  . Acid reflux   . Anxiety   . Bipolar 1 disorder (HCC)   . Depression   . Polysubstance abuse    etoh, marijuana, molly, cocaine    Past Surgical History:  Procedure Laterality Date  . ANKLE SURGERY    . KNEE ARTHROPLASTY    . KNEE SURGERY     Family History:  Family History  Problem Relation Age of Onset  . Drug abuse Mother   . Drug abuse Father    Family Psychiatric  History: see HPI  Social History:  History  Alcohol Use  . Yes    Comment: daily, bingwe drinks on the weekend     History  Drug Use  . Frequency: 1.0 time per week  . Types:  Marijuana, Cocaine, MDMA (Ecstacy)    Comment: molly    Social History   Social History  . Marital status: Single    Spouse name: N/A  . Number of children: N/A  . Years of education: N/A   Social History Main Topics  . Smoking status: Never Smoker  . Smokeless tobacco: Never Used  . Alcohol use Yes     Comment: daily, bingwe drinks on the weekend  . Drug use:     Frequency: 1.0 time per week    Types: Marijuana, Cocaine, MDMA (Ecstacy)     Comment: molly  . Sexual activity: Yes    Birth control/ protection: None   Other Topics Concern  . None   Social History Narrative  . None   Additional Social History:    Sleep: Good  Appetite:  Good  Current Medications: Current Facility-Administered Medications  Medication Dose Route Frequency Provider Last Rate Last Dose  . alum & mag hydroxide-simeth (MAALOX/MYLANTA) 200-200-20 MG/5ML suspension 30 mL  30 mL Oral Q4H PRN Jackelyn Poling, NP   30 mL at 11/29/16 1050  . divalproex (DEPAKOTE ER) 24 hr tablet 1,000 mg  1,000 mg Oral QHS Craige Cotta, MD   1,000 mg at 11/28/16 2054  . fluticasone (FLONASE) 50 MCG/ACT nasal spray 2 spray  2 spray Each Nare Daily Adonis Brook, NP   2 spray at 11/29/16 1050  . ibuprofen (  ADVIL,MOTRIN) tablet 600 mg  600 mg Oral Q6H PRN Laveda Abbe, NP   600 mg at 11/29/16 1336  . magnesium hydroxide (MILK OF MAGNESIA) suspension 30 mL  30 mL Oral Daily PRN Jackelyn Poling, NP   30 mL at 11/27/16 2041  . QUEtiapine (SEROQUEL) tablet 200 mg  200 mg Oral QHS Craige Cotta, MD   200 mg at 11/28/16 2054  . traZODone (DESYREL) tablet 100 mg  100 mg Oral QHS PRN Jackelyn Poling, NP   100 mg at 11/28/16 2144   Lab Results:  No results found for this or any previous visit (from the past 48 hour(s)).  Blood Alcohol level:  Lab Results  Component Value Date   [facility] <5 11/21/2016   ETH <5 03/08/2015   Metabolic Disorder Labs: Lab Results  Component Value Date   HGBA1C 5.9 (H) 11/23/2016   MPG  123 11/23/2016   MPG 117 11/22/2016   Lab Results  Component Value Date   PROLACTIN 30.5 (H) 11/22/2016   Lab Results  Component Value Date   CHOL 175 11/22/2016   TRIG 165 (H) 11/22/2016   HDL 39 (L) 11/22/2016   CHOLHDL 4.5 11/22/2016   VLDL 33 11/22/2016   LDLCALC 103 (H) 11/22/2016   Physical Findings: AIMS: Facial and Oral Movements Muscles of Facial Expression: None, normal Lips and Perioral Area: None, normal Jaw: None, normal Tongue: None, normal,Extremity Movements Upper (arms, wrists, hands, fingers): None, normal Lower (legs, knees, ankles, toes): None, normal, Trunk Movements Neck, shoulders, hips: None, normal, Overall Severity Severity of abnormal movements (highest score from questions above): None, normal Incapacitation due to abnormal movements: None, normal Patient's awareness of abnormal movements (rate only patient's report): No Awareness, Dental Status Current problems with teeth and/or dentures?: No Does patient usually wear dentures?: No  CIWA:  CIWA-Ar Total: 0 COWS:  COWS Total Score: 0  Musculoskeletal: Strength & Muscle Tone: within normal limits Gait & Station: normal Patient leans: N/A  Psychiatric Specialty Exam: Physical Exam  Nursing note and vitals reviewed.   Review of Systems  Psychiatric/Behavioral: Positive for depression ("Improving") and substance abuse. Negative for hallucinations, memory loss and suicidal ideas. The patient is nervous/anxious ("improving") and has insomnia ("improved").     Blood pressure (!) 121/97, pulse (!) 114, temperature 97.9 F (36.6 C), resp. rate 16, height 5\' 11"  (1.803 m), weight 122.5 kg (270 lb).Body mass index is 37.66 kg/m.  General Appearance: Casual and Neat  Eye Contact:  Good  Speech:  Clear and Coherent  Volume:  Normal  Mood:  Depressed  Affect:  Congruent  Thought Process:  Coherent   Orientation:  Full (Time, Place, and Person)  Thought Content:  Rumination, concerned about head  pain he is having  Suicidal Thoughts:  No  Homicidal Thoughts:  No  Memory:  Immediate;   Fair Recent;   Fair Remote;   Fair  Judgement:  Fair  Insight:  Fair  Psychomotor Activity:  Normal  Concentration:  Concentration: Fair and Attention Span: Fair  Recall:  [employer] of Knowledge:  Good  Language:  Good  Akathisia:  Negative  Handed:  Right  AIMS (if indicated):     Assets:  Communication Skills Desire for Improvement Financial Resources/Insurance Resilience Social Support  ADL's:  Intact  Cognition:  WNL  Sleep:  Number of Hours: 6   Treatment Plan Summary: Review of chart, vital signs, medications, and notes.   1-Individual and group therapy   2-Medication  management for depression and anxiety: Medications reviewed with the patient.   Will continue Seroquel 200 mg QHS mood control.  Will continue Depakote DR 1,000 mg Q HS for  mood stabilization, reviewed recent lab reports, sub-therapeutic.    Will continue Vistaril 25 mg PRN anxiety  Continue Ibuprofen 600 mg Q6h PRN for mild/moderate pain  3-Coping skills for depression, anxiety   4-Continue crisis stabilization and management   5-Address health issues--monitoring vital signs, stable   6-Address concerns regarding head pain  7-Treatment plan in progress to prevent relapse of depression and anxiety. 8. Initiated Excedrin migraine for the headache pains.  Armandina Stammerwoko, Agnes I, NP-BC, PMHNP-BC 11/29/2016, 3:07 PM  I agree to the plan.

## 2016-11-29 NOTE — [facility] Group Notes (Signed)
[facility] Group Notes:  (Nursing/MHT/Case Management/Adjunct)  Date:  11/29/2016  Time:  4:43 PM  Type of Therapy:  Nurse Education  Participation Level:  Minimal  Participation Quality:  Inattentive and Redirectable  Affect:  Blunted  Cognitive:  Alert and Oriented  Insight:  Improving  Engagement in Group:  Developing/Improving  Modes of Intervention:  Discussion and Education  Summary of Progress/Problems: patient came late to group and was in and out, but did participate. Defined recovery in terms of being a father and the head of the household.   Vinetta BergamoBarbara M Treyson Axel 11/29/2016, 4:43 PM

## 2016-11-29 NOTE — Plan of Care (Signed)
Problem: Activity: Goal: Imbalance in normal sleep/wake cycle will improve Outcome: Progressing Patient spent most of the day out of bed and reports a better night's sleep last evening.

## 2016-11-29 NOTE — Progress Notes (Signed)
Psychoeducational Group Note  Date:  11/29/2016 Time:  2237  Group Topic/Focus:  Wrap-Up Group:   The focus of this group is to help patients review their daily goal of treatment and discuss progress on daily workbooks.   Participation Level: Did Not Attend  Participation Quality:  Not Applicable  Affect:  Not Applicable  Cognitive:  Not Applicable  Insight:  Not Applicable  Engagement in Group: Not Applicable  Additional Comments:  The patient did not attend the A.A. Meeting since he was complaining of a migraine headache.    Hazle CocaGOODMAN, Wayne Brunker S 11/29/2016, 10:37 PM

## 2016-11-29 NOTE — [facility] Group Notes (Signed)
[facility] LCSW Group Therapy  11/29/2016 10 - 11 AM  Type of Therapy:  Group Therapy  Participation Level:  Minimal  Participation Quality:  Attentive  Affect:  Depressed  Cognitive:  Oriented  Insight:  None shared  Engagement in Therapy:  None noted  Modes of Intervention:  Activity, Discussion, Exploration, Socialization and Support  Summary of Progress/Problems: Topic for today was thoughts and feelings regarding discharge. We discussed fears of upcoming changes including judgements, expectations and stigma of mental health issues. We then discussed supports: what constitutes a supportive framework, identification of supports and what to do when others are not supportive. Pt engaged easily during group session. As patients processed their anxiety about discharge and described healthy supports patient was absent until last 10 minutes.  Patient choose not to participate in activity yet did remain in group for last 10 minutes of session.    Gabriel Bernatherine C Harrill, LCSW

## 2016-11-29 NOTE — Plan of Care (Signed)
Problem: Medication: Goal: Compliance with prescribed medication regimen will improve Outcome: Progressing Pt is compliant with medication regimen   

## 2016-11-29 NOTE — Progress Notes (Signed)
D.  Pt initially in pain on approach, had migraine headache.  Pt had been trying to use ice packs on area where pain was but stated that this had little effect.  Pt requested medication.  NP notified and came to speak with Pt, new orders received.  Pt was unable to attend most of group due to headache.  After receiving ordered shot, Pt did report that he felt much better and was observed in dayroom interacting appropriately with peers.  Pt denies SI/HI/hallucinations at this time and is hopeful for discharge tomorrow.  Pt would like prescription for Trazodone upon discharge.  A.  Support and encouragement offered, medication given as ordered.  R.  Pt remains safe on the unit, will continue to monitor.

## 2016-11-30 MED ORDER — QUETIAPINE FUMARATE 200 MG PO TABS: 200.0000 mg | ORAL_TABLET | Freq: Every day | ORAL | 0 refills | Status: AC

## 2016-11-30 MED ORDER — DIVALPROEX SODIUM ER 500 MG PO TB24: 1000.0000 mg | ORAL_TABLET | Freq: Every day | ORAL | 0 refills | Status: AC

## 2016-11-30 MED ORDER — TRAZODONE HCL 100 MG PO TABS: 100.0000 mg | ORAL_TABLET | Freq: Every evening | ORAL | 0 refills | Status: AC | PRN

## 2016-11-30 MED ORDER — HYDROXYZINE HCL 25 MG PO TABS: 25.0000 mg | ORAL_TABLET | Freq: Four times a day (QID) | ORAL | 0 refills | Status: AC | PRN

## 2016-11-30 NOTE — Tx Team (Signed)
Interdisciplinary Treatment and Diagnostic Plan Update  11/30/2016 Time of Session: 9:30AM Gabriel Barnes MRN: [id number]  Principal Diagnosis: Bipolar 1 disorder, mixed, moderate (HCC)  Secondary Diagnoses: Principal Problem:   Bipolar 1 disorder, mixed, moderate (HCC) Active Problems:   Alcohol use disorder, moderate, dependence (HCC)   Cannabis use disorder, severe, dependence ([place])   MDMA abuse   Current Medications:  Current Facility-Administered Medications  Medication Dose Route Frequency Provider Last Rate Last Dose  . alum & mag hydroxide-simeth (MAALOX/MYLANTA) 200-200-20 MG/5ML suspension 30 mL  30 mL Oral Q4H PRN Rozetta Nunnery, NP   30 mL at 11/29/16 1050  . divalproex (DEPAKOTE ER) 24 hr tablet 1,000 mg  1,000 mg Oral QHS Jenne Campus, MD   1,000 mg at 11/29/16 2057  . fluticasone (FLONASE) 50 MCG/ACT nasal spray 2 spray  2 spray Each Nare Daily Kerrie Buffalo, NP   2 spray at 11/30/16 0812  . hydrOXYzine (ATARAX/VISTARIL) tablet 25 mg  25 mg Oral Q6H PRN Encarnacion Slates, NP   25 mg at 11/30/16 0814  . ibuprofen (ADVIL,MOTRIN) tablet 600 mg  600 mg Oral Q6H PRN Ethelene Hal, NP   600 mg at 11/29/16 1336  . magnesium hydroxide (MILK OF MAGNESIA) suspension 30 mL  30 mL Oral Daily PRN Rozetta Nunnery, NP   30 mL at 11/27/16 2041  . QUEtiapine (SEROQUEL) tablet 200 mg  200 mg Oral QHS Jenne Campus, MD   200 mg at 11/29/16 2058  . traZODone (DESYREL) tablet 100 mg  100 mg Oral QHS,MR X 1 Rozetta Nunnery, NP   100 mg at 11/29/16 2155   PTA Medications: Prescriptions Prior to Admission  Medication Sig Dispense Refill Last Dose  . divalproex (DEPAKOTE ER) 500 MG 24 hr tablet Take 1 tablet (500 mg total) by mouth 2 (two) times daily. For mood stabilization (Patient not taking: Reported on 11/21/2016) 60 tablet 0 Not Taking at Unknown time  . hydrOXYzine (ATARAX/VISTARIL) 25 MG tablet Take 25 mg by mouth 3 (three) times daily as needed for anxiety.   Not Taking at  Unknown time  . QUEtiapine (SEROQUEL) 200 MG tablet Take 1 tablet (200 mg total) by mouth at bedtime. For mood contol (Patient not taking: Reported on 11/21/2016) 30 tablet 0 Not Taking at Unknown time  . QUEtiapine (SEROQUEL) 50 MG tablet Take 1 tablet (50 mg total) by mouth 2 (two) times daily. For mood control/depression (Patient not taking: Reported on 11/21/2016) 60 tablet 0 Not Taking at Unknown time  . traZODone (DESYREL) 50 MG tablet Take 1 tablet (50 mg total) by mouth at bedtime as needed for sleep. (Patient not taking: Reported on 11/21/2016) 30 tablet 0 Not Taking at Unknown time    Patient Stressors: Financial difficulties Health problems Medication change or noncompliance Substance abuse  Patient Strengths: Active sense of humor General fund of knowledge Motivation for treatment/growth Supportive family/friends  Treatment Modalities: Medication Management, Group therapy, Case management,  1 to 1 session with clinician, Psychoeducation, Recreational therapy.   Physician Treatment Plan for Primary Diagnosis: Bipolar 1 disorder, mixed, moderate ([place]) Long Term Goal(s): Improvement in symptoms so as ready for discharge Improvement in symptoms so as ready for discharge   Short Term Goals: Ability to identify changes in lifestyle to reduce recurrence of condition will improve Ability to verbalize feelings will improve Ability to disclose and discuss suicidal ideas Ability to demonstrate self-control will improve Ability to identify and develop effective coping behaviors will improve  Ability to maintain clinical measurements within normal limits will improve Compliance with prescribed medications will improve Ability to identify triggers associated with substance abuse/mental health issues will improve Ability to identify changes in lifestyle to reduce recurrence of condition will improve Ability to verbalize feelings will improve Ability to disclose and discuss suicidal  ideas Ability to demonstrate self-control will improve Ability to identify and develop effective coping behaviors will improve Ability to maintain clinical measurements within normal limits will improve Compliance with prescribed medications will improve Ability to identify triggers associated with substance abuse/mental health issues will improve  Medication Management: Evaluate patient's response, side effects, and tolerance of medication regimen.  Therapeutic Interventions: 1 to 1 sessions, Unit Group sessions and Medication administration.  Evaluation of Outcomes: Met  Physician Treatment Plan for Secondary Diagnosis: Principal Problem:   Bipolar 1 disorder, mixed, moderate (HCC) Active Problems:   Alcohol use disorder, moderate, dependence (HCC)   Cannabis use disorder, severe, dependence ([place])   MDMA abuse  Long Term Goal(s): Improvement in symptoms so as ready for discharge Improvement in symptoms so as ready for discharge   Short Term Goals: Ability to identify changes in lifestyle to reduce recurrence of condition will improve Ability to verbalize feelings will improve Ability to disclose and discuss suicidal ideas Ability to demonstrate self-control will improve Ability to identify and develop effective coping behaviors will improve Ability to maintain clinical measurements within normal limits will improve Compliance with prescribed medications will improve Ability to identify triggers associated with substance abuse/mental health issues will improve Ability to identify changes in lifestyle to reduce recurrence of condition will improve Ability to verbalize feelings will improve Ability to disclose and discuss suicidal ideas Ability to demonstrate self-control will improve Ability to identify and develop effective coping behaviors will improve Ability to maintain clinical measurements within normal limits will improve Compliance with prescribed medications will  improve Ability to identify triggers associated with substance abuse/mental health issues will improve     Medication Management: Evaluate patient's response, side effects, and tolerance of medication regimen.  Therapeutic Interventions: 1 to 1 sessions, Unit Group sessions and Medication administration.  Evaluation of Outcomes: Met   RN Treatment Plan for Primary Diagnosis: Bipolar 1 disorder, mixed, moderate (HCC) Long Term Goal(s): Knowledge of disease and therapeutic regimen to maintain health will improve  Short Term Goals: Ability to remain free from injury will improve, Ability to disclose and discuss suicidal ideas and Ability to identify and develop effective coping behaviors will improve  Medication Management: RN will administer medications as ordered by provider, will assess and evaluate patient's response and provide education to patient for prescribed medication. RN will report any adverse and/or side effects to prescribing provider.  Therapeutic Interventions: 1 on 1 counseling sessions, Psychoeducation, Medication administration, Evaluate responses to treatment, Monitor vital signs and CBGs as ordered, Perform/monitor CIWA, COWS, AIMS and Fall Risk screenings as ordered, Perform wound care treatments as ordered.  Evaluation of Outcomes: Met   LCSW Treatment Plan for Primary Diagnosis: Bipolar 1 disorder, mixed, moderate ([place]) Long Term Goal(s): Safe transition to appropriate next level of care at discharge, Engage patient in therapeutic group addressing interpersonal concerns.  Short Term Goals: Engage patient in aftercare planning with referrals and resources, Facilitate patient progression through stages of change regarding substance use diagnoses and concerns and Identify triggers associated with mental health/substance abuse issues  Therapeutic Interventions: Assess for all discharge needs, 1 to 1 time with Social worker, Explore available resources and support systems,  Assess for  adequacy in community support network, Educate family and significant other(s) on suicide prevention, Complete Psychosocial Assessment, Interpersonal group therapy.  Evaluation of Outcomes: Met   Progress in Treatment: Attending groups: Yes. Participating in groups: Yes. Intermittently  Taking medication as prescribed: Yes. Toleration medication: Yes. Family/Significant other contact made: pt gave permission for CSW to contact his wife. Contact attempt made/not able to reach her at this time.  Patient understands diagnosis: Yes. Discussing patient identified problems/goals with staff: Yes. Medical problems stabilized or resolved: Yes. Denies suicidal/homicidal ideation: Yes. Issues/concerns per patient self-inventory: No. Other: n/a  New problem(s) identified: No, Describe:  n/a  New Short Term/Long Term Goal(s): detox; medication stabilization; development of comprehensive mental wellness/sobriety plan.   Discharge Plan or Barriers: Pt plans to follow-up at [facility] and [facility] for medication management. Pt also provided with AA/NA list for [place] information for additional support/resources in the community.   Reason for Continuation of Hospitalization: none  Estimated Length of Stay: d/c today  Attendees: Patient: 11/30/2016 9:35 AM  Physician: Dr. Shea Evans MD; Dr. Parke Poisson MD 11/30/2016 9:35 AM  Nursing: Shella Maxim RN 11/30/2016 9:35 AM  RN Care Manager: Lars Pinks CM 11/30/2016 9:35 AM  Social Worker: Maxie Better, LCSW; Adriana Reams NP 11/30/2016 9:35 AM  Recreational Therapist:  Rhunette Croft 11/30/2016 9:35 AM  Other: Samuel Jester NP; Ricky Ala NP 11/30/2016 9:35 AM  Other:  11/30/2016 9:35 AM  Other: 11/30/2016 9:35 AM    Scribe for Treatment Team: [place], LCSW 11/30/2016 9:35 AM

## 2016-11-30 NOTE — Progress Notes (Signed)
Recreation Therapy Notes   Date: 11/30/16 Time: 0930 Location: [address] Group Room  Group Topic: Stress Management  Goal Area(s) Addresses:  Patient will verbalize importance of using healthy stress management.  Patient will identify positive emotions associated with healthy stress management.   Intervention: Stress Management   Activity :  Body Scan Meditation.  LRT introduced the stress management technique of meditation to patients.  LRT played a meditation from the Calm app to allow patients the opportunity to engage in the meditation.  Patients were to follow along as the meditation played.  Education:  Stress Management, Discharge Planning.   Education Outcome: Acknowledges edcuation/In group clarification offered/Needs additional education  Clinical Observations/Feedback: Pt did not attend group.    Gabriel Barnes, LRT/CTRS         Gabriel Barnes A 11/30/2016 12:24 PM 

## 2016-11-30 NOTE — Progress Notes (Signed)
  [facility] :  Will you be returning to the same living situation after discharge:  Yes,  home with wife At discharge, do you have transportation home?: Yes,  wife Do you have the ability to pay for your medications: Yes,  mental health  Release of information consent forms completed and submitted to medical records by CSW.  Patient to Follow up at: Follow-up Information    [facility] Follow up.   Specialty:  Behavioral Health Why:  Walk in  Contact information: [address] [place] [address] [phone]        [facility].   Specialty:  Behavioral Health Why:  Open access hours: Monday, Wednesday, and Friday between 12:30PM and 3:00PM--Please walk in to be assessed for Substance abuse Intensive Outpatient Program within 7 days of discharge from the hospital. Thank you.  Contact information: [address] [place] [address] [phone]           Next level of care provider [facility] Link:no  Safety Planning and Suicide Prevention discussed: Yes,  SPE completed with pt's wife; pt provided with SPI pamphlet and Mobile Crisis information.  Have you used any form of tobacco in the last 30 days? (Cigarettes, Smokeless Tobacco, Cigars, and/or Pipes): No  Has patient been referred to the Quitline?: N/A patient is not a smoker  Patient has been referred for addiction treatment: Yes  Ledell PeoplesHeather N Smart LCSW 11/30/2016, 9:35 AM

## 2016-11-30 NOTE — [facility] Suicide Risk Assessment (Signed)
[facility] Discharge Suicide Risk Assessment   Principal Problem: Bipolar 1 disorder, mixed, moderate (HCC) Discharge Diagnoses:  Patient Active Problem List   Diagnosis Date Noted  . Alcohol use disorder, moderate, dependence (HCC) [F10.20] 11/22/2016  . Cannabis use disorder, severe, dependence (HCC) [F12.20] 11/22/2016  . 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 Substance abuse [F19.10] 03/09/2015  . Bipolar 1 disorder, mixed, moderate (HCC) [F31.62]   . Depression [F32.9] 03/08/2015    Total Time spent with patient: 30 minutes  Musculoskeletal: Strength & Muscle Tone: within normal limits Gait & Station: normal Patient leans: N/A  Psychiatric Specialty Exam: ROS Currently not endorsing headache, denies chest pain, denies shortness of breath, no vomiting   Blood pressure 133/71, pulse (!) 125, temperature 98 F (36.7 C), temperature source Oral, resp. rate 16, height 5\' 11"  (1.803 m), weight 122.5 kg (270 lb).Body mass index is 37.66 kg/m.  General Appearance: improved grooming   Eye Contact::  Good  Speech:  Normal Rate409  Volume:  Normal  Mood:  improved, currently denies depression  Affect:  Appropriate and smiles at times appropriately   Thought Process:  Linear  Orientation:  Full (Time, Place, and Person)  Thought Content:  denies hallucinations, no delusions, not internally preoccupied   Suicidal Thoughts:  No denies any suicidal or self injurious ideations , denies any homicidal or violent ideations  Homicidal Thoughts:  No  Memory:  recent and remote grossly intact   Judgement:  Other:  improving   Insight:  improving   Psychomotor Activity:  Normal  Concentration:  Good  Recall:  Good  Fund of Knowledge:Good  Language: Good  Akathisia:  Negative  Handed:  Right  AIMS (if indicated):   no abnormal involuntary movements noted or reported   Assets:  Desire for Improvement Resilience  Sleep:  Number of Hours: 6.75  Cognition: WNL  ADL's:  Intact   Mental Status Per  Nursing Assessment::   On Admission:     Demographic Factors:  36 year old male , currently separated   Loss Factors: Medication non compliance , unemployment , living alone   Historical Factors: Polysubstance Abuse, history of mood disorder, prior psychiatric admission has been diagnosed with Bipolar Disorder in the past   Risk Reduction Factors:   Sense of responsibility to family, Living with another person, especially a relative and Positive coping skills or problem solving skills  Continued Clinical Symptoms:  At this time patient is improved compared to admission - mood is described as improved and presents euthymic, with full range of affect, no thought disorder, no suicidal or self injurious ideations, no psychotic symptoms, future oriented . Denies medication side effects- we reviewed side effect profile to include risk of sedation, metabolic side effects, weight gain.   Cognitive Features That Contribute To Risk:  No gross cognitive deficits noted upon discharge. Is alert , attentive, and oriented x 3   Suicide Risk:  Mild:  Suicidal ideation of limited frequency, intensity, duration, and specificity.  There are no identifiable plans, no associated intent, mild dysphoria and related symptoms, good self-control (both objective and subjective assessment), few other risk factors, and identifiable protective factors, including available and accessible social support.  Follow-up Information    [facility] Follow up.   Specialty:  Behavioral Health Why:  Walk in  Contact information: [address] [place] [address] [phone]        [facility] Follow [facility].   Specialty:  Behavioral Health Why:  Open access hours:  Monday, Wednesday, and Friday between 12:30PM and 3:00PM--Please walk in to be assessed for Substance abuse Intensive Outpatient Program within 7 days of discharge from the hospital. Thank you.  Contact information: [address] [place] [address] [phone]           Plan Of Care/Follow-up recommendations:  Activity:  as tolerated Diet:  Heart Healthy Tests:  NA Other:  See below  Patient is leaving unit in good spirits  Plans to follow up as above Plans to follow up with his PCP for medical issues as needed  12 step program participation encouraged .  Nehemiah MassedOBOS, Natanya Holecek, MD 11/30/2016, 12:32 PM

## 2016-11-30 NOTE — Progress Notes (Signed)
Patient discharged to lobby. Patient was stable and appreciative at that time. All papers, samples and prescriptions were given and valuables returned. Verbal understanding expressed. Denies SI/HI and A/VH. Patient given opportunity to express concerns and ask questions.  

## 2016-11-30 NOTE — Discharge Summary (Signed)
Physician Discharge Summary Note  Patient:  Gabriel FurlongRudolph T Barnes is an 36 y.o., male MRN:  [id number] DOB:  07-25-1981 Patient phone:  [phone] (home)  Patient address:   [address] [place] [id number],  Total Time spent with patient: 30 minutes  Date of Admission:  11/21/2016 Date of Discharge: 11/30/2016  Reason for Admission:  Worsening depression and substance abuse  Principal Problem: Bipolar 1 disorder, mixed, moderate (HCC) Discharge Diagnoses: Patient Active Problem List   Diagnosis Date Noted  . Alcohol use disorder, moderate, dependence (HCC) [F10.20] 11/22/2016  . Cannabis use disorder, severe, dependence (HCC) [F12.20] 11/22/2016  . 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 Substance abuse [F19.10] 03/09/2015  . Bipolar 1 disorder, mixed, moderate (HCC) [F31.62]   . Depression [F32.9] 03/08/2015    Past Psychiatric History: see HPI   Past Medical History:  Past Medical History:  Diagnosis Date  . Acid reflux   . Anxiety   . Bipolar 1 disorder (HCC)   . Depression   . Polysubstance abuse    etoh, marijuana, molly, cocaine    Past Surgical History:  Procedure Laterality Date  . ANKLE SURGERY    . KNEE ARTHROPLASTY    . KNEE SURGERY     Family History:  Family History  Problem Relation Age of Onset  . Drug abuse Mother   . Drug abuse Father    Family Psychiatric  History: see HPI Social History:  History  Alcohol Use  . Yes    Comment: daily, bingwe drinks on the weekend     History  Drug Use  . Frequency: 1.0 time per week  . Types: Marijuana, Cocaine, MDMA (Ecstacy)    Comment: molly    Social History   Social History  . Marital status: Single    Spouse name: N/A  . Number of children: N/A  . Years of education: N/A   Social History Main Topics  . Smoking status: Never Smoker  . Smokeless tobacco: Never Used  . Alcohol use Yes     Comment: daily, bingwe drinks on the weekend  . Drug use:     Frequency: 1.0 time per week    Types:  Marijuana, Cocaine, MDMA (Ecstacy)     Comment: molly  . Sexual activity: Yes    Birth control/ protection: None   Other Topics Concern  . None   Social History Narrative  . None    Hospital Course:  Tor NettersRudolph T McElroyis an 36 y.o. presented [facility]accompanied [facility] reporting symptoms of depression and substance abuse. [facility] with a documented diagnosis of Bipolar I Disorder and reports he stopped taking his psychiatric medications approximately two months ago for reasons he cannot explain.  Upon admission, he reported current suicidal ideation with thoughts of overdosing on substances.    Gabriel Barnes was admitted for Bipolar 1 disorder, mixed, moderate (HCC) and crisis management.  Patient was treated with medications with their indications listed below in detail under Medication List.  Medical problems were identified and treated as needed.  Home medications were restarted as appropriate.  Improvement was monitored by observation and Gabriel Barnes daily report of symptom reduction.  Emotional and mental status was monitored by daily self inventory reports completed by Gabriel Furlongudolph T Wendorf and clinical staff.  Patient reported continued improvement, denied any new concerns.  Patient had been compliant on medications and denied side effects.  Support and encouragement was provided.  Gabriel Barnes was evaluated by the treatment team for stability and plans for continued recovery upon discharge.  Patient was offered further treatment options upon discharge including Residential, Intensive Outpatient and Outpatient treatment. Patient will follow up with agency listed below for medication management and counseling.  Encouraged patient to maintain satisfactory support network and home environment.  Advised to adhere to medication compliance and outpatient treatment follow up.  Prescriptions provided.       Gabriel Barnes motivation was an integral factor  for scheduling further treatment.  Employment, transportation, bed availability, health status, family support, and any pending legal issues were also considered during patient's hospital stay.  Upon completion of this admission the patient was both mentally and medically stable for discharge denying suicidal/homicidal ideation, auditory/visual/tactile hallucinations, delusional thoughts and paranoia.        Physical Findings: AIMS: Facial and Oral Movements Muscles of Facial Expression: None, normal Lips and Perioral Area: None, normal Jaw: None, normal Tongue: None, normal,Extremity Movements Upper (arms, wrists, hands, fingers): None, normal Lower (legs, knees, ankles, toes): None, normal, Trunk Movements Neck, shoulders, hips: None, normal, Overall Severity Severity of abnormal movements (highest score from questions above): None, normal Incapacitation due to abnormal movements: None, normal Patient's awareness of abnormal movements (rate only patient's report): No Awareness, Dental Status Current problems with teeth and/or dentures?: No Does patient usually wear dentures?: No  CIWA:  CIWA-Ar Total: 0 COWS:  COWS Total Score: 0  Musculoskeletal: Strength & Muscle Tone: within normal limits Gait & Station: normal Patient leans: N/A  Psychiatric Specialty Exam:  SEE MD SRA Physical Exam  Nursing note and vitals reviewed.   ROS  Blood pressure 133/71, pulse (!) 125, temperature 98 F (36.7 C), temperature source Oral, resp. rate 16, height 5\' 11"  (1.803 m), weight 122.5 kg (270 lb).Body mass index is 37.66 kg/m.    Have you used any form of tobacco in the last 30 days? (Cigarettes, Smokeless Tobacco, Cigars, and/or Pipes): No  Has this patient used any form of tobacco in the last 30 days? (Cigarettes, Smokeless Tobacco, Cigars, and/or Pipes) Yes, N/A  Blood Alcohol level:  Lab Results  Component Value Date   ETH <5 11/21/2016   ETH <5 03/08/2015    Metabolic Disorder  Labs:  Lab Results  Component Value Date   HGBA1C 5.9 (H) 11/23/2016   MPG 123 11/23/2016   MPG 117 11/22/2016   Lab Results  Component Value Date   PROLACTIN 30.5 (H) 11/22/2016   Lab Results  Component Value Date   CHOL 175 11/22/2016   TRIG 165 (H) 11/22/2016   HDL 39 (L) 11/22/2016   CHOLHDL 4.5 11/22/2016   VLDL 33 11/22/2016   LDLCALC 103 (H) 11/22/2016    See Psychiatric Specialty Exam and Suicide Risk Assessment completed by Attending Physician prior to discharge.  Discharge destination:  Home  Is patient on multiple antipsychotic therapies at discharge:  No   Has Patient had three or more failed trials of antipsychotic monotherapy by history:  No  Recommended Plan for Multiple Antipsychotic Therapies: NA   Allergies as of 11/30/2016      Reactions   Tylenol [acetaminophen] Rash      Medication List    TAKE these medications     Indication  divalproex 500 MG 24 hr tablet Commonly known as:  DEPAKOTE ER Take 2 tablets (1,000 mg total) by mouth at bedtime. What changed:  how much to take  when to take this  additional instructions  Indication:  mood stabilization   hydrOXYzine 25 MG tablet Commonly known as:  ATARAX/VISTARIL Take 1 tablet (25 mg total) by mouth every 6 (six) hours as needed for anxiety. What changed:  when to take this  Indication:  Anxiety Neurosis   QUEtiapine 200 MG tablet Commonly known as:  SEROQUEL Take 1 tablet (200 mg total) by mouth at bedtime. What changed:  medication strength  how much to take  when to take this  additional instructions  Another medication with the same name was removed. Continue taking this medication, and follow the directions you see here.  Indication:  mood stabilization   traZODone 100 MG tablet Commonly known as:  DESYREL Take 1 tablet (100 mg total) by mouth at bedtime and may repeat dose one time if needed. What changed:  medication strength  how much to take  when to take  this  reasons to take this  Indication:  Trouble Sleeping      Follow-up Information    [facility] Follow up.   Specialty:  Behavioral Health Why:  Walk in  Contact information: [address] [place] [address] [phone]        [facility] Follow [facility].   Specialty:  Behavioral Health Why:  Open access hours: Monday, Wednesday, and Friday between 12:30PM and 3:00PM--Please walk in to be assessed for Substance abuse Intensive Outpatient Program within 7 days of discharge from the hospital. Thank you.  Contact information: [address] [place] [address] [phone]           Follow-up recommendations:  Activity:  as tol Diet:  as tol  Comments:  1.  Take all your medications as prescribed.   2.  Report any adverse side effects to outpatient provider. 3.  Patient instructed to not use alcohol or illegal drugs while on prescription medicines. 4.  In the event of worsening symptoms, instructed patient to call 911, the crisis hotline or go to nearest emergency room for evaluation of symptoms.  Signed: Lindwood Qua, NP [facility] 11/30/2016, 9:56 AM   Patient seen, Suicide Assessment Completed.  Disposition Plan Reviewed

## 2017-04-05 ENCOUNTER — Emergency Department (HOSPITAL_COMMUNITY)
Admission: EM | Admit: 2017-04-05 | Discharge: 2017-04-06 | Disposition: A | Discharge status: Home / Self Care | Payer: Self-pay | Attending: Emergency Medicine | Admitting: Emergency Medicine

## 2017-04-05 ENCOUNTER — Encounter (HOSPITAL_COMMUNITY): Payer: Self-pay | Admitting: Emergency Medicine

## 2017-04-05 DIAGNOSIS — F1914 Other psychoactive substance abuse with psychoactive substance-induced mood disorder: Secondary | ICD-10-CM | POA: Insufficient documentation

## 2017-04-05 DIAGNOSIS — Z79899 Other long term (current) drug therapy: Secondary | ICD-10-CM | POA: Insufficient documentation

## 2017-04-05 DIAGNOSIS — F1994 Other psychoactive substance use, unspecified with psychoactive substance-induced mood disorder: Secondary | ICD-10-CM | POA: Diagnosis present

## 2017-04-05 LAB — COMPREHENSIVE METABOLIC PANEL
ALBUMIN: 5 g/dL (ref 3.5–5.0)
ALK PHOS: 79 U/L (ref 38–126)
ALT: 27 U/L (ref 17–63)
ANION GAP: 12 (ref 5–15)
AST: 23 U/L (ref 15–41)
BILIRUBIN TOTAL: 0.7 mg/dL (ref 0.3–1.2)
BUN: 17 mg/dL (ref 6–20)
CALCIUM: 9.9 mg/dL (ref 8.9–10.3)
CO2: 25 mmol/L (ref 22–32)
CREATININE: 1.11 mg/dL (ref 0.61–1.24)
Chloride: 99 mmol/L — ABNORMAL LOW (ref 101–111)
GFR calc Af Amer: >60 mL/min (ref >60)
GFR calc non Af Amer: >60 mL/min (ref >60)
GLUCOSE: 124 mg/dL — AB (ref 65–99)
Potassium: 3.8 mmol/L (ref 3.5–5.1)
Sodium: 136 mmol/L (ref 135–145)
TOTAL PROTEIN: 8.9 g/dL — AB (ref 6.5–8.1)

## 2017-04-05 LAB — CBC
HEMATOCRIT: 49.9 % (ref 39.0–52.0)
Hemoglobin: 17.4 g/dL — ABNORMAL HIGH (ref 13.0–17.0)
MCH: 30.7 pg (ref 26.0–34.0)
MCHC: 34.9 g/dL (ref 30.0–36.0)
MCV: 88 fL (ref 78.0–100.0)
PLATELETS: 492 10*3/uL — AB (ref 150–400)
RBC: 5.67 MIL/uL (ref 4.22–5.81)
RDW: 14.2 % (ref 11.5–15.5)
WBC: 9.8 10*3/uL (ref 4.0–10.5)

## 2017-04-05 LAB — ETHANOL: Alcohol, Ethyl (B): <5 mg/dL (ref <5)

## 2017-04-05 LAB — SALICYLATE LEVEL: Salicylate Lvl: <7 mg/dL (ref 2.8–30.0)

## 2017-04-05 LAB — RAPID URINE DRUG SCREEN, HOSP PERFORMED
Amphetamines: POSITIVE — AB
BARBITURATES: NOT DETECTED
Benzodiazepines: POSITIVE — AB
COCAINE: NOT DETECTED
Opiates: NOT DETECTED
Tetrahydrocannabinol: NOT DETECTED

## 2017-04-05 LAB — ACETAMINOPHEN LEVEL: Acetaminophen (Tylenol), Serum: <10 ug/mL — ABNORMAL LOW (ref 10–30)

## 2017-04-05 MED ORDER — TRAZODONE HCL 100 MG PO TABS
100.0000 mg | ORAL_TABLET | Freq: Every evening | ORAL | Status: DC | PRN
  Administered 2017-04-05 – 2017-04-06 (×2): 100 mg via ORAL
  Filled 2017-04-05 (×2): qty 1

## 2017-04-05 MED ORDER — HYDROXYZINE HCL 25 MG PO TABS
25.0000 mg | ORAL_TABLET | Freq: Four times a day (QID) | ORAL | Status: DC | PRN
  Administered 2017-04-05: 25 mg via ORAL
  Filled 2017-04-05: qty 1

## 2017-04-05 MED ORDER — LORAZEPAM 1 MG PO TABS
1.0000 mg | ORAL_TABLET | Freq: Once | ORAL | Status: AC
  Administered 2017-04-05: 1 mg via ORAL
  Filled 2017-04-05: qty 1

## 2017-04-05 NOTE — ED Notes (Signed)
TTS at bedside. 

## 2017-04-05 NOTE — BH Assessment (Addendum)
Assessment Note  Gabriel Barnes is an 36 y.o. male that presents this date after going to [facility] to inquire about assistance with ETOH issues and S/I. Patient was transported by EMS to [facility] stating he intended to "overdose on drugs" although was not specific in reference to where he would acquire any substances to overdose. Patient is oriented to time/place and denies any HI or AVH. Patient admits to one prior attempt at self harm stating he attempted to overdose "years ago" but was vague in reference to details. Patient admits to "binge using "of alcohol reporting he consumes up to one gallon of liquor over the course of two days with these episodes occurring every two months. Patient denies any other SA use but has a history of polysubstance use per notes and tested positive for Benzos and amphetamines this date. Patient speaks in a low voice and is noted to be agitated although patient states the agitation is due to ETOH withdrawals with symptoms to include: agitation, tremors and nausea. Patient reports he consumed one gallon of liquor over the last 24 hours. Patient also reports being depressed with symptoms to include: isolating and feelings of being worthless. Per notes patient has been diagnosed with Bipolar 1 and has a history of multiple admissions at different facilities with last admission at [facility] on 11/30/16 for SA issues and S/I. Patient states he "used" to receive services (medication management) from [facility] but has not been seen by that provider (for medications) since 2017. Per notes, Patient came from [facility] with complaints of SI and insomnia. Patient states that mothers day is usually a trigger for him and caused him extra emotional distress. Patient is also currently reporting anxiety and agitation. Patient has SI with a plan to "do drugs and drink until he didn't wake up." Case was staffed with Shaune Pollack DNP who recommended patient be re-evaluated in the a.m.  Diagnosis: Bipolar 1 most recent  episode depressed, Polysubstance abuse severe  Past Medical History:  Past Medical History:  Diagnosis Date  . Acid reflux   . Anxiety   . Bipolar 1 disorder (HCC)   . Depression   . Polysubstance abuse    etoh, marijuana, molly, cocaine    Past Surgical History:  Procedure Laterality Date  . ANKLE SURGERY    . KNEE ARTHROPLASTY    . KNEE SURGERY      Family History:  Family History  Problem Relation Age of Onset  . Drug abuse Mother   . Drug abuse Father     Social History:  reports that he has never smoked. He has never used smokeless tobacco. He reports that he drinks alcohol. He reports that he uses drugs, including Marijuana, Cocaine, and MDMA (Ecstacy), about 1 time per week.  Additional Social History:  Alcohol / Drug Use Pain Medications: Denies use Prescriptions: Stopped medications two months ago Over the Counter: Denies use History of alcohol / drug use?: Yes Longest period of sobriety (when/how long): One year Negative Consequences of Use: Personal relationships Withdrawal Symptoms: Sweats, Agitation Substance #1 Name of Substance 1: Alcohol 1 - Age of First Use: 16 1 - Amount (size/oz): 1 gallon 1 - Frequency: Pt states they go on binges every other weeks, pt reports using up to one gallon   1 - Duration: Last two years 1 - Last Use / Amount: 04/05/17 pt states they consumed one gallon over the last two days  CIWA: CIWA-Ar BP: (!) 140/118 Pulse Rate: (!) 117 COWS:  Allergies:  Allergies  Allergen Reactions  . Tylenol [Acetaminophen] Rash    Home Medications:  (Not in a hospital admission)  OB/GYN Status:  No LMP for male patient.  General Assessment Data Location of Assessment: [facility] ED TTS Assessment: In system Is this a Tele or Face-to-Face Assessment?: Face-to-Face Is this an Initial Assessment or a Re-assessment for this encounter?: Initial Assessment Marital status: Married GoodrichMaiden name: NA Is patient pregnant?: No Pregnancy Status:  No Living Arrangements: Spouse/significant other Can pt return to current living arrangement?: Yes Admission Status: Voluntary Is patient capable of signing voluntary admission?: Yes Referral Source: Self/Family/Friend Insurance type: Self pay  Medical Screening Exam [facility] Walk-in ONLY) Medical Exam completed: Yes  Crisis Care Plan Living Arrangements: Spouse/significant other Legal Guardian:  (NA) Name of Psychiatrist: [facility] Name of Therapist: None  Education Status Is patient currently in school?: No Current Grade:  (NA) Highest grade of school patient has completed: 12 Name of school: NA (NA) Contact person:  (NA)  Risk to self with the past 6 months Suicidal Ideation: Yes-Currently Present Has patient been a risk to self within the past 6 months prior to admission? : Yes Suicidal Intent: Yes-Currently Present Has patient had any suicidal intent within the past 6 months prior to admission? : Yes Is patient at risk for suicide?: Yes Suicidal Plan?: Yes-Currently Present Has patient had any suicidal plan within the past 6 months prior to admission? : Yes Specify Current Suicidal Plan: Overdose Access to Means: No What has been your use of drugs/alcohol within the last 12 months?: Current use Previous Attempts/Gestures: Yes How many times?: 1 Other Self Harm Risks: NA Triggers for Past Attempts: Unknown Intentional Self Injurious Behavior: None Family Suicide History: No Recent stressful life event(s): Other (Comment) (Family issues) Persecutory voices/beliefs?: No Depression: Yes Depression Symptoms: Isolating, Feeling worthless/self pity Substance abuse history and/or treatment for substance abuse?: No Suicide prevention information given to non-admitted patients: Not applicable  Risk to Others within the past 6 months Homicidal Ideation: No Does patient have any lifetime risk of violence toward others beyond the six months prior to admission? : No Thoughts of  Harm to Others: No Current Homicidal Intent: No Current Homicidal Plan: No Access to Homicidal Means: No Identified Victim: NA History of harm to others?: No Assessment of Violence: None Noted Violent Behavior Description: NA Does patient have access to weapons?: No Criminal Charges Pending?: No Does patient have a court date: No Is patient on probation?: No  Psychosis Hallucinations: None noted Delusions: None noted  Mental Status Report Appearance/Hygiene: In scrubs Eye Contact: Fair Motor Activity: Agitation Speech: Soft, Slow Level of Consciousness: Irritable Mood: Depressed Affect: Anxious Anxiety Level: Moderate Thought Processes: Coherent, Relevant Judgement: Partial Orientation: Person, Place, Time Obsessive Compulsive Thoughts/Behaviors: None  Cognitive Functioning Concentration: Normal Memory: Recent Intact, Remote Intact IQ: Average Insight: Fair Impulse Control: Fair Appetite: Fair Weight Loss: 0 Weight Gain: 0 Sleep: Decreased Total Hours of Sleep: 5 Vegetative Symptoms: None  ADLScreening [facility] Assessment Services) Patient's cognitive ability adequate to safely complete daily activities?: Yes Patient able to express need for assistance with ADLs?: Yes Independently performs ADLs?: Yes (appropriate for developmental age)  Prior Inpatient Therapy Prior Inpatient Therapy: Yes Prior Therapy Dates: 2018 Prior Therapy Facilty/Provider(s): [facility] Reason for Treatment: S/I, SA issues  Prior Outpatient Therapy Prior Outpatient Therapy: Yes Prior Therapy Dates: 2017 Prior Therapy Facilty/Provider(s): [facility] Reason for Treatment: SI, SA issues Does patient have an ACCT team?: No Does patient have Intensive In-House Services?  :  No Does patient have [facility] services? : Yes Does patient have P4CC services?: No  ADL Screening (condition at time of admission) Patient's cognitive ability adequate to safely complete daily activities?: Yes Is the  patient deaf or have difficulty hearing?: No Does the patient have difficulty seeing, even when wearing glasses/contacts?: No Does the patient have difficulty concentrating, remembering, or making decisions?: No Patient able to express need for assistance with ADLs?: Yes Does the patient have difficulty dressing or bathing?: No Independently performs ADLs?: Yes (appropriate for developmental age) Does the patient have difficulty walking or climbing stairs?: No Weakness of Legs: None Weakness of Arms/Hands: None  Home Assistive Devices/Equipment Home Assistive Devices/Equipment: None  Therapy Consults (therapy consults require a physician order) PT Evaluation Needed: No OT Evalulation Needed: No SLP Evaluation Needed: No Abuse/Neglect Assessment (Assessment to be complete while patient is alone) Physical Abuse: Denies Verbal Abuse: Denies Sexual Abuse: Denies Exploitation of patient/patient's resources: Denies Self-Neglect: Denies Values / Beliefs Cultural Requests During Hospitalization: None Spiritual Requests During Hospitalization: None Consults Spiritual Care Consult Needed: No Social Work Consult Needed: No Merchant navy officer (For Healthcare) Does Patient Have a Medical Advance Directive?: No Would patient like information on creating a medical advance directive?: No - Patient declined    Additional Information 1:1 In Past 12 Months?: No CIRT Risk: No Elopement Risk: No Does patient have medical clearance?: Yes     Disposition: Case was staffed with Shaune Pollack DNP who recommended patient be re-evaluated in the a.m.   Disposition Initial Assessment Completed for this Encounter: Yes Disposition of Patient: Other dispositions Other disposition(s): Other (Comment) (Re-evaluate in the a.m.)  On Site Evaluation by:   Reviewed with Physician:    Alfredia Ferguson 04/05/2017 5:17 PM

## 2017-04-05 NOTE — ED Triage Notes (Addendum)
[facility] with complaints of SI and insomnia. Pt states that mothers day is usually a trigger for him and caused him extra emotional distress. Pt is also currently reporting anxiety and agitation. Pt has SI with a plan to "do drugs and drink until he didn't wake up"

## 2017-04-05 NOTE — ED Notes (Signed)
ED Provider at bedside. 

## 2017-04-05 NOTE — ED Provider Notes (Signed)
[facility]-EMERGENCY DEPT Provider Note   CSN: [id number] Arrival date & time: 04/05/17  1513     History   Chief Complaint Chief Complaint  Patient presents with  . Suicidal  . Anxiety    HPI Gabriel Barnes is a 36 y.o. male.  HPI  Patient with a past medical history of bipolar 1 disorder, depression and polysubstance abuse presents with suicidal ideations that he states started yesterday. He states that he has been experiencing depressive symptoms on and off since the death of his mother "many years ago." He states that Mother's Day is  particularly hard for him. He has been drinking and doing drugs including marijuana and Molly. Last drink was this morning. He presents here today because he knows he needs help. States he has not been taking his medications for a couple of months. Patient states that he has not been able to sleep for the past few nights due to his sadness. Patient is seen at [facility]. Denies any chest pain, trouble breathing, abdominal pain, nausea, vomiting, fever.  Past Medical History:  Diagnosis Date  . Acid reflux   . Anxiety   . Bipolar 1 disorder (HCC)   . Depression   . Polysubstance abuse    etoh, marijuana, molly, cocaine    Patient Active Problem List   Diagnosis Date Noted  . Alcohol use disorder, moderate, dependence (HCC) 11/22/2016  . Cannabis use disorder, severe, dependence (HCC) 11/22/2016  . MDMA abuse 11/22/2016  . Substance abuse 03/09/2015  . Bipolar 1 disorder, mixed, moderate (HCC)   . Depression 03/08/2015    Past Surgical History:  Procedure Laterality Date  . ANKLE SURGERY    . KNEE ARTHROPLASTY    . KNEE SURGERY         Home Medications    Prior to Admission medications   Medication Sig Start Date End Date Taking? Authorizing Provider  methocarbamol (ROBAXIN) 500 MG tablet Take 500 mg by mouth daily as needed for muscle spasms.  04/02/17  Yes [provider]  predniSONE (DELTASONE) 20 MG tablet Take 60 mg by  mouth daily. For 5 days 04/02/17 04/07/17 Yes [provider]  traMADol (ULTRAM) 50 MG tablet Take 50 mg by mouth daily as needed for moderate pain.  04/02/17 04/07/17 Yes [provider]  divalproex (DEPAKOTE ER) 500 MG 24 hr tablet Take 2 tablets (1,000 mg total) by mouth at bedtime. Patient not taking: Reported on 04/05/2017 11/30/16   Adonis Brook, NP  hydrOXYzine (ATARAX/VISTARIL) 25 MG tablet Take 1 tablet (25 mg total) by mouth every 6 (six) hours as needed for anxiety. Patient not taking: Reported on 04/05/2017 11/30/16   Adonis Brook, NP  QUEtiapine (SEROQUEL) 200 MG tablet Take 1 tablet (200 mg total) by mouth at bedtime. Patient not taking: Reported on 04/05/2017 11/30/16   Adonis Brook, NP  traZODone (DESYREL) 100 MG tablet Take 1 tablet (100 mg total) by mouth at bedtime and may repeat dose one time if needed. Patient not taking: Reported on 04/05/2017 11/30/16   Adonis Brook, NP    Family History Family History  Problem Relation Age of Onset  . Drug abuse Mother   . Drug abuse Father     Social History Social History  Substance Use Topics  . Smoking status: Never Smoker  . Smokeless tobacco: Never Used  . Alcohol use Yes     Comment: daily, bingwe drinks on the weekend     Allergies   Tylenol [acetaminophen]   Review  of Systems Review of Systems  Constitutional: Negative for appetite change, chills and fever.  Eyes: Negative for visual disturbance.  Respiratory: Negative for chest tightness and shortness of breath.   Cardiovascular: Negative for chest pain and palpitations.  Gastrointestinal: Negative for abdominal pain, nausea and vomiting.  Neurological: Negative for syncope, light-headedness and headaches.  Psychiatric/Behavioral: Positive for sleep disturbance and suicidal ideas. Negative for hallucinations. The patient is nervous/anxious. The patient is not hyperactive.      Physical Exam Updated Vital Signs BP (!) 149/81   Pulse (!)  116   Temp 98.1 F (36.7 C) (Oral)   Resp 18   SpO2 100%   Physical Exam  Constitutional: He appears well-developed and well-nourished. No distress.  Tearful, anxious.  HENT:  Head: Normocephalic and atraumatic.  Nose: Nose normal.  Eyes: Conjunctivae and EOM are normal. Left eye exhibits no discharge. No scleral icterus.  Neck: Normal range of motion. Neck supple.  Cardiovascular: Regular rhythm, normal heart sounds and intact distal pulses.  Tachycardia present.  Exam reveals no gallop and no friction rub.   No murmur heard. Pulmonary/Chest: Effort normal and breath sounds normal. No respiratory distress.  Musculoskeletal: Normal range of motion. He exhibits no edema.  Neurological: He is alert. He exhibits normal muscle tone. Coordination normal.  Skin: Skin is warm and dry. No rash noted.  Psychiatric: He has a normal mood and affect.  Nursing note and vitals reviewed.    ED Treatments / Results  Labs (all labs ordered are listed, but only abnormal results are displayed) Labs Reviewed  COMPREHENSIVE METABOLIC PANEL - Abnormal; Notable for the following:       Result Value   Chloride 99 (*)    Glucose, Bld 124 (*)    Total Protein 8.9 (*)    All other components within normal limits  ACETAMINOPHEN LEVEL - Abnormal; Notable for the following:    Acetaminophen (Tylenol), Serum <10 (*)    All other components within normal limits  CBC - Abnormal; Notable for the following:    Hemoglobin 17.4 (*)    Platelets 492 (*)    All other components within normal limits  RAPID URINE DRUG SCREEN, HOSP PERFORMED - Abnormal; Notable for the following:    Benzodiazepines POSITIVE (*)    Amphetamines POSITIVE (*)    All other components within normal limits  ETHANOL  SALICYLATE LEVEL    EKG  EKG Interpretation None       Radiology No results found.  Procedures Procedures (including critical care time)  Medications Ordered in ED Medications  hydrOXYzine  (ATARAX/VISTARIL) tablet 25 mg (25 mg Oral Given 04/05/17 2019)  traZODone (DESYREL) tablet 100 mg (100 mg Oral Given 04/06/17 0057)  LORazepam (ATIVAN) tablet 1 mg (1 mg Oral Given 04/05/17 1606)  LORazepam (ATIVAN) tablet 1 mg (1 mg Oral Given 04/05/17 1804)     Initial Impression / Assessment and Plan / ED Course  I have reviewed the triage vital signs and the nursing notes.  Pertinent labs & imaging results that were available during my care of the patient were reviewed by me and considered in my medical decision making (see chart for details).     Patient presents with suicidal ideations and plans to drink himself to death. Also has mild irritability and agitation. Patient is tachycardic at 117. Given Ativan to help with this And agitation. UDS positive for benzos and amphetamines. CBC, CMP unremarkable. Acetaminophen level, salicylate level and ethanol level all normal. Patient has no  other complaints at this time. Patient is [facility] will [facility] patient in the morning.  Final Clinical Impressions(s) / ED Diagnoses   Final diagnoses:  None    New Prescriptions New Prescriptions   No medications on file     Dietrich PatesKhatri, Rainn Zupko, PA-C 04/06/17 0159    LongArlyss Repress, Joshua G, MD 04/06/17 [id number]

## 2017-04-05 NOTE — ED Notes (Signed)
Patient wanded by security. 

## 2017-04-05 NOTE — Progress Notes (Signed)
Pt ambulatory to [facility] with a steady gait accompanied by ED staff. Presents guarded with flat affect and depressed mood. Per report [facility] with c/o SI with plan to do drugs and etoh to death. Endorsed passive SI "it comes and go" on initial contact. Verbally contracts for safety. Denies HI, AVH and pain when assessed. Per pt "I feel like my depression has been worse recently, I did not grieve my mom well, I did not see her body for the funeral because I was locked up in prison and it's getting to me and I also want to be detox from all this drug and etoh". Pt reports h/o of medication noncompliance for months, states "I don't work, I can't afford it". Unit orientation and routines discussed with pt, understanding verbalized. Emotional support and availability provided to pt. Encouraged to comply with current treatment regimen. Q 15 minutes safety checks initiated without self harm gestures or outburst thus far.

## 2017-04-05 NOTE — ED Notes (Signed)
Pt A&O x 3, no distress noted, calm & cooperative at present.  Monitoring for safety, Q 15 min checks in effect. 

## 2017-04-05 NOTE — BH Assessment (Signed)
[facility] Assessment Progress Note   Case was staffed with Lord DNP who recommended patient be re-evaluated in the a.m.    

## 2017-04-05 NOTE — ED Notes (Signed)
Patient changed into paper scrubs. Patient and belongings wanded by security. 

## 2017-04-05 NOTE — ED Notes (Signed)
Pt requesting additional ativan, PA made aware

## 2017-04-06 DIAGNOSIS — F1490 Cocaine use, unspecified, uncomplicated: Secondary | ICD-10-CM

## 2017-04-06 DIAGNOSIS — Z813 Family history of other psychoactive substance abuse and dependence: Secondary | ICD-10-CM

## 2017-04-06 DIAGNOSIS — F1290 Cannabis use, unspecified, uncomplicated: Secondary | ICD-10-CM

## 2017-04-06 DIAGNOSIS — F1994 Other psychoactive substance use, unspecified with psychoactive substance-induced mood disorder: Secondary | ICD-10-CM | POA: Diagnosis present

## 2017-04-06 NOTE — [facility] Suicide Risk Assessment (Signed)
Suicide Risk Assessment  Discharge Assessment   [facility]   Principal Problem: Substance induced mood disorder [facility](HCC) Discharge Diagnoses:  Patient Active Problem List   Diagnosis Date Noted  . Substance induced mood disorder (HCC) [F19.94] 04/06/2017    Priority: High  . Alcohol use disorder, moderate, dependence (HCC) [F10.20] 11/22/2016  . Cannabis use disorder, severe, dependence (HCC) [F12.20] 11/22/2016  . MDMA abuse [F16.10] 11/22/2016  . Substance abuse [F19.10] 03/09/2015  . Bipolar 1 disorder, mixed, moderate (HCC) [F31.62]   . Depression [F32.9] 03/08/2015    Total Time spent with patient: 45 minutes  Musculoskeletal: Strength & Muscle Tone: within normal limits Gait & Station: normal Patient leans: N/A  Psychiatric Specialty Exam: Physical Exam  Constitutional: He is oriented to person, place, and time. He appears well-developed and well-nourished.  HENT:  Head: Normocephalic.  Neck: Normal range of motion.  Respiratory: Effort normal.  Musculoskeletal: Normal range of motion.  Neurological: He is alert and oriented to person, place, and time.  Psychiatric: His speech is normal and behavior is normal. Judgment and thought content normal. His affect is angry. Cognition and memory are normal.    Review of Systems  Psychiatric/Behavioral: Positive for substance abuse.  All other systems reviewed and are negative.   Blood pressure (!) 117/93, pulse (!) 110, temperature 98.1 F (36.7 C), temperature source Oral, resp. rate 16, SpO2 100 %.There is no height or weight on file to calculate BMI.  General Appearance: Casual  Eye Contact:  Good  Speech:  Normal Rate  Volume:  Normal  Mood:  Angry and Depressed  Affect:  Congruent  Thought Process:  Coherent and Descriptions of Associations: Intact  Orientation:  Full (Time, Place, and Person)  Thought Content:  WDL and Logical  Suicidal Thoughts:  No  Homicidal Thoughts:  No  Memory:   Immediate;   Good Recent;   Good Remote;   Good  Judgement:  Fair  Insight:  Fair  Psychomotor Activity:  Normal  Concentration:  Concentration: Good and Attention Span: Good  Recall:  Good  Fund of Knowledge:  Fair  Language:  Good  Akathisia:  No  Handed:  Right  AIMS (if indicated):     Assets:  Leisure Time Physical Health Resilience  ADL's:  Intact  Cognition:  WNL  Sleep:       Mental Status Per Nursing Assessment::   On Admission:   substance abuse with suicidal ideations  Demographic Factors:  Male  Loss Factors: Legal issues  Historical Factors: NA  Risk Reduction Factors:   Sense of responsibility to family, Positive social support and Positive therapeutic relationship  Continued Clinical Symptoms:  Depression (mild) and anger,   Cognitive Features That Contribute To Risk:  None    Suicide Risk:  Minimal: No identifiable suicidal ideation.  Patients presenting with no risk factors but with morbid ruminations; may be classified as minimal risk based on the severity of the depressive symptoms    Plan Of Care/Follow-up recommendations:  Activity:  as tolerated Diet:  heart healthy diet  LORD, JAMISON, NP 04/06/2017, 12:47 PM

## 2017-04-06 NOTE — Consult Note (Signed)
[facility]iat[facility] Consult   Reason for Consult:  Substance abuse with suicidal ideations Referring Physician:  EDP Patient Identification: Gabriel Barnes MRN:  [id number] Principal Diagnosis: Substance induced mood disorder ([place]) Diagnosis:   Patient Active Problem List   Diagnosis Date Noted  . Substance induced mood disorder ([place]) [F19.94] 04/06/2017    Priority: High  . Alcohol use disorder, moderate, dependence ([place]) [F10.20] 11/22/2016  . Cannabis use disorder, severe, dependence ([place]) [F12.20] 11/22/2016  . MDMA abuse [F16.10] 11/22/2016  . Substance abuse [F19.10] 03/09/2015  . Bipolar 1 disorder, mixed, moderate ([place]) [F31.62]   . Depression [F32.9] 03/08/2015    Total Time spent with patient: 45 minutes  Subjective:   Gabriel Barnes is a 36 y.o. male patient does not warrant admission.  HPI:  36 yo male who presented to the ED for alcohol detox but negative BAL and suicidal ideations.  Reports he was abusing amphetamines and marijuana.  Initially, her reported using a gallon of liquor prior to admission but negative BAL.  Adonus has outstanding warrants for breaking parole and assaulting his girlfriend.  Today, he was asked about legal issues and he became angry and threatening to the psychiatrist and other patients.  He was at [facility] in January with legal issues at that time.  No homicidal ideations, hallucinations, or withdrawal symptoms.  Contracts for safety, discharged to [facility].  Past Psychiatric History: substance abuse, depression  Risk to Self: NOne Risk to Others: Homicidal Ideation: No Thoughts of Harm to Others: No Current Homicidal Intent: No Current Homicidal Plan: No Access to Homicidal Means: No Identified Victim: NA History of harm to others?: No Assessment of Violence: None Noted Violent Behavior Description: NA Does patient have access to weapons?: No Criminal Charges Pending?: No Does patient have a court date: No Prior Inpatient  Therapy: Prior Inpatient Therapy: Yes Prior Therapy Dates: 2018 Prior Therapy Facilty/Provider(s): [facility] Reason for Treatment: S/I, SA issues Prior Outpatient Therapy: Prior Outpatient Therapy: Yes Prior Therapy Dates: 2017 Prior Therapy Facilty/Provider(s): [facility] Reason for Treatment: SI, SA issues Does patient have an ACCT team?: No Does patient have Intensive In-House Services?  : No Does patient have [facility] services? : Yes Does patient have P4CC services?: No  Past Medical History:  Past Medical History:  Diagnosis Date  . Acid reflux   . Anxiety   . Bipolar 1 disorder ([place])   . Depression   . Polysubstance abuse    etoh, marijuana, molly, cocaine    Past Surgical History:  Procedure Laterality Date  . ANKLE SURGERY    . KNEE ARTHROPLASTY    . KNEE SURGERY     Family History:  Family History  Problem Relation Age of Onset  . Drug abuse Mother   . Drug abuse Father    Family Psychiatric  History: none Social History:  History  Alcohol Use  . Yes    Comment: daily, bingwe drinks on the weekend     History  Drug Use  . Frequency: 1.0 time per week  . Types: Marijuana, Cocaine, MDMA (Ecstacy)    Comment: molly    Social History   Social History  . Marital status: Single    Spouse name: N/A  . Number of children: N/A  . Years of education: N/A   Social History Main Topics  . Smoking status: Never Smoker  . Smokeless tobacco: Never Used  . Alcohol use Yes     Comment: daily, bingwe drinks on the weekend  . Drug  use: Yes    Frequency: 1.0 time per week    Types: Marijuana, Cocaine, MDMA (Ecstacy)     Comment: molly  . Sexual activity: Yes    Birth control/ protection: None   Other Topics Concern  . None   Social History Narrative  . None   Additional Social History:    Allergies:   Allergies  Allergen Reactions  . Tylenol [Acetaminophen] Rash    Labs:  Results for orders placed or performed during the hospital encounter of 04/05/17  (from the past 48 hour(s))  Rapid urine drug screen (hospital performed)     Status: Abnormal   Collection Time: 04/05/17  3:47 PM  Result Value Ref Range   Opiates NONE DETECTED NONE DETECTED   Cocaine NONE DETECTED NONE DETECTED   Benzodiazepines POSITIVE (A) NONE DETECTED   Amphetamines POSITIVE (A) NONE DETECTED   Tetrahydrocannabinol NONE DETECTED NONE DETECTED   Barbiturates NONE DETECTED NONE DETECTED    Comment:        DRUG SCREEN FOR MEDICAL PURPOSES ONLY.  IF CONFIRMATION IS NEEDED FOR ANY PURPOSE, NOTIFY LAB WITHIN 5 DAYS.        LOWEST DETECTABLE LIMITS FOR URINE DRUG SCREEN Drug Class       Cutoff (ng/mL) Amphetamine      1000 Barbiturate      200 Benzodiazepine   865 Tricyclics       784 Opiates          300 Cocaine          300 THC              50   Comprehensive metabolic panel     Status: Abnormal   Collection Time: 04/05/17  4:56 PM  Result Value Ref Range   Sodium 136 135 - 145 mmol/L   Potassium 3.8 3.5 - 5.1 mmol/L   Chloride 99 (L) 101 - 111 mmol/L   CO2 25 22 - 32 mmol/L   Glucose, Bld 124 (H) 65 - 99 mg/dL   BUN 17 6 - 20 mg/dL   Creatinine, Ser 1.11 0.61 - 1.24 mg/dL   Calcium 9.9 8.9 - 10.3 mg/dL   Total Protein 8.9 (H) 6.5 - 8.1 g/dL   Albumin 5.0 3.5 - 5.0 g/dL   AST 23 15 - 41 U/L   ALT 27 17 - 63 U/L   Alkaline Phosphatase 79 38 - 126 U/L   Total Bilirubin 0.7 0.3 - 1.2 mg/dL   GFR calc non Af Amer >60 >60 mL/min   GFR calc Af Amer >60 >60 mL/min    Comment: (NOTE) The eGFR has been calculated using the CKD EPI equation. This calculation has not been validated in all clinical situations. eGFR's persistently <60 mL/min signify possible Chronic Kidney Disease.    Anion gap 12 5 - 15  Ethanol     Status: None   Collection Time: 04/05/17  4:56 PM  Result Value Ref Range   Alcohol, Ethyl (B) <5 <5 mg/dL    Comment:        LOWEST DETECTABLE LIMIT FOR SERUM ALCOHOL IS 5 mg/dL FOR MEDICAL PURPOSES ONLY   Salicylate level     Status:  None   Collection Time: 04/05/17  4:56 PM  Result Value Ref Range   Salicylate Lvl <6.9 2.8 - 30.0 mg/dL  Acetaminophen level     Status: Abnormal   Collection Time: 04/05/17  4:56 PM  Result Value Ref Range   Acetaminophen (Tylenol), Serum <10 (  L) 10 - 30 ug/mL    Comment:        THERAPEUTIC CONCENTRATIONS VARY SIGNIFICANTLY. A RANGE OF 10-30 ug/mL MAY BE AN EFFECTIVE CONCENTRATION FOR MANY PATIENTS. HOWEVER, SOME ARE BEST TREATED AT CONCENTRATIONS OUTSIDE THIS RANGE. ACETAMINOPHEN CONCENTRATIONS >150 ug/mL AT 4 HOURS AFTER INGESTION AND >50 ug/mL AT 12 HOURS AFTER INGESTION ARE OFTEN ASSOCIATED WITH TOXIC REACTIONS.   cbc     Status: Abnormal   Collection Time: 04/05/17  4:56 PM  Result Value Ref Range   WBC 9.8 4.0 - 10.5 K/uL   RBC 5.67 4.22 - 5.81 MIL/uL   Hemoglobin 17.4 (H) 13.0 - 17.0 g/dL   HCT 49.9 39.0 - 52.0 %   MCV 88.0 78.0 - 100.0 fL   MCH 30.7 26.0 - 34.0 pg   MCHC 34.9 30.0 - 36.0 g/dL   RDW 14.2 11.5 - 15.5 %   Platelets 492 (H) 150 - 400 K/uL    Current Facility-Administered Medications  Medication Dose Route Frequency Provider Last Rate Last Dose  . hydrOXYzine (ATARAX/VISTARIL) tablet 25 mg  25 mg Oral Q6H PRN Tegeler, Gwenyth Allegra, MD   25 mg at 04/05/17 2019  . traZODone (DESYREL) tablet 100 mg  100 mg Oral QHS PRN,MR X 1 Laverle Hobby, PA-C   100 mg at 04/06/17 1610   Current Outpatient Prescriptions  Medication Sig Dispense Refill  . methocarbamol (ROBAXIN) 500 MG tablet Take 500 mg by mouth daily as needed for muscle spasms.     . predniSONE (DELTASONE) 20 MG tablet Take 60 mg by mouth daily. For 5 days    . traMADol (ULTRAM) 50 MG tablet Take 50 mg by mouth daily as needed for moderate pain.     . divalproex (DEPAKOTE ER) 500 MG 24 hr tablet Take 2 tablets (1,000 mg total) by mouth at bedtime. (Patient not taking: Reported on 04/05/2017) 60 tablet 0  . hydrOXYzine (ATARAX/VISTARIL) 25 MG tablet Take 1 tablet (25 mg total) by mouth every 6  (six) hours as needed for anxiety. (Patient not taking: Reported on 04/05/2017) 30 tablet 0  . QUEtiapine (SEROQUEL) 200 MG tablet Take 1 tablet (200 mg total) by mouth at bedtime. (Patient not taking: Reported on 04/05/2017) 30 tablet 0  . traZODone (DESYREL) 100 MG tablet Take 1 tablet (100 mg total) by mouth at bedtime and may repeat dose one time if needed. (Patient not taking: Reported on 04/05/2017) 30 tablet 0    Musculoskeletal: Strength & Muscle Tone: within normal limits Gait & Station: normal Patient leans: N/A  Psychiatric Specialty Exam: Physical Exam  Constitutional: He is oriented to person, place, and time. He appears well-developed and well-nourished.  HENT:  Head: Normocephalic.  Neck: Normal range of motion.  Respiratory: Effort normal.  Musculoskeletal: Normal range of motion.  Neurological: He is alert and oriented to person, place, and time.  Psychiatric: His speech is normal and behavior is normal. Judgment and thought content normal. His affect is angry. Cognition and memory are normal.    Review of Systems  Psychiatric/Behavioral: Positive for substance abuse.  All other systems reviewed and are negative.   Blood pressure (!) 117/93, pulse (!) 110, temperature 98.1 F (36.7 C), temperature source Oral, resp. rate 16, SpO2 100 %.There is no height or weight on file to calculate BMI.  General Appearance: Casual  Eye Contact:  Good  Speech:  Normal Rate  Volume:  Normal  Mood:  angry  Affect:  Congruent  Thought Process:  Coherent  and Descriptions of Associations: Intact  Orientation:  Full (Time, Place, and Person)  Thought Content:  WDL and Logical  Suicidal Thoughts:  No  Homicidal Thoughts:  No  Memory:  Immediate;   Good Recent;   Good Remote;   Good  Judgement:  Fair  Insight:  Fair  Psychomotor Activity:  Normal  Concentration:  Concentration: Good and Attention Span: Good  Recall:  Good  Fund of Knowledge:  Fair  Language:  Good  Akathisia:   No  Handed:  Right  AIMS (if indicated):     Assets:  Leisure Time Physical Health Resilience  ADL's:  Intact  Cognition:  WNL  Sleep:        Treatment Plan Summary: Daily contact with patient to assess and evaluate symptoms and progress in treatment, Medication management and Plan substance induced mood disorder:  -Crisis stabilization -Medication management: -Individual and substance abuse counseling  Disposition: No evidence of imminent risk to self or others at present.    Waylan Boga, NP 04/06/2017 9:46 AM  Patient seen face-to-face for psychiatric evaluation, chart reviewed and case discussed with the physician extender and developed treatment plan. Reviewed the information documented and agree with the treatment plan. Corena Pilgrim, MD

## 2017-04-06 NOTE — BH Assessment (Signed)
[facility] Assessment Progress Note  Per Thedore MinsMojeed Akintayo, MD, this pt does not require psychiatric hospitalization at this time.  [facility].  No referrals are necessary at this time.  Pt's nurse, Lincoln MaxinOlivette, has been notified.  Doylene Canninghomas Bryer Gottsch, MA Triage Specialist [phone]

## 2017-08-28 IMAGING — DX DG ANKLE COMPLETE 3+V*R*
3 series · 3 of 3 positions shown · non-contrast
Comparison: 01/24/2016.

CLINICAL DATA: Right ankle pain, most pronounced laterally, after
twisting the ankle last night when going down stairs.

EXAM:
RIGHT ANKLE - COMPLETE 3+ VIEW

[ankle ap]
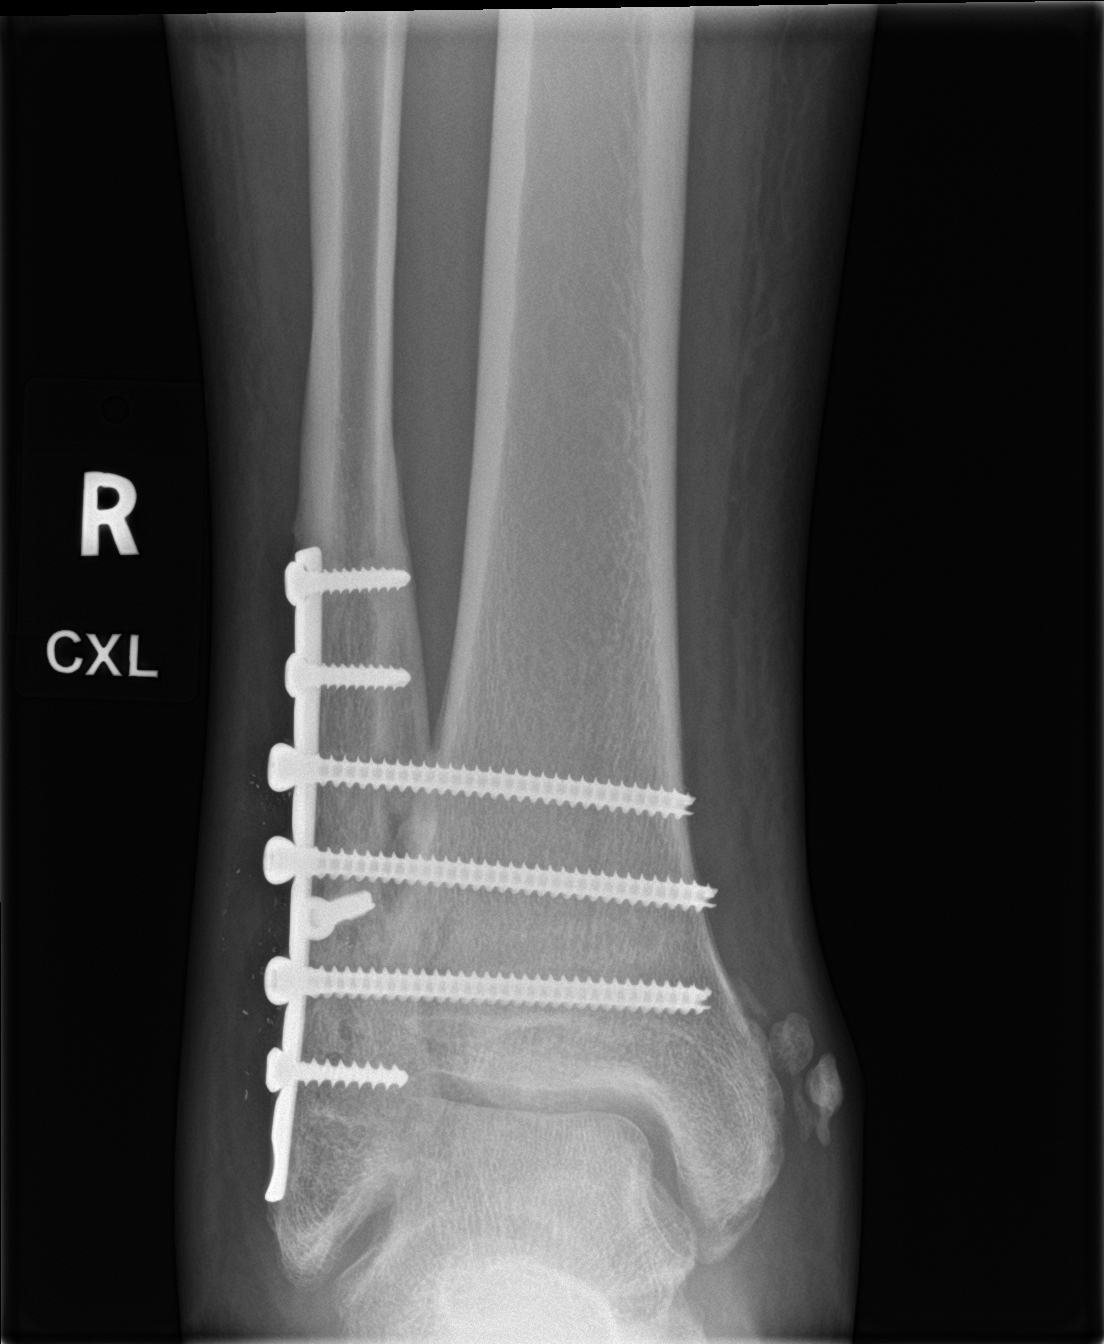

[ankle obl]
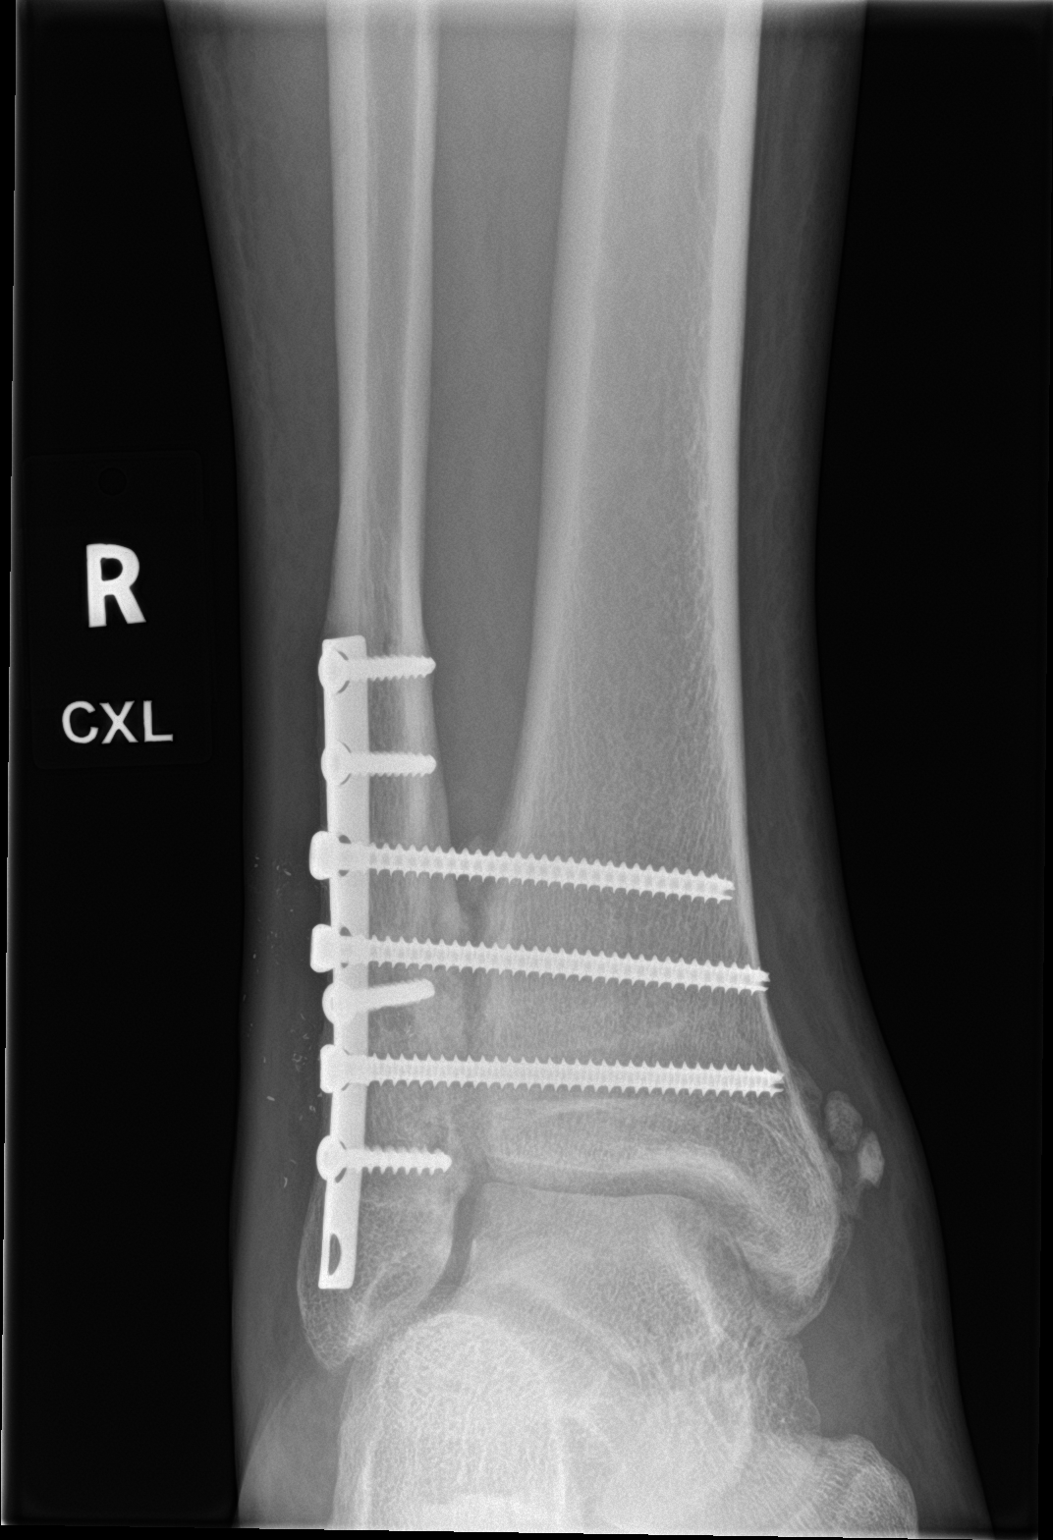

[ankle lat]
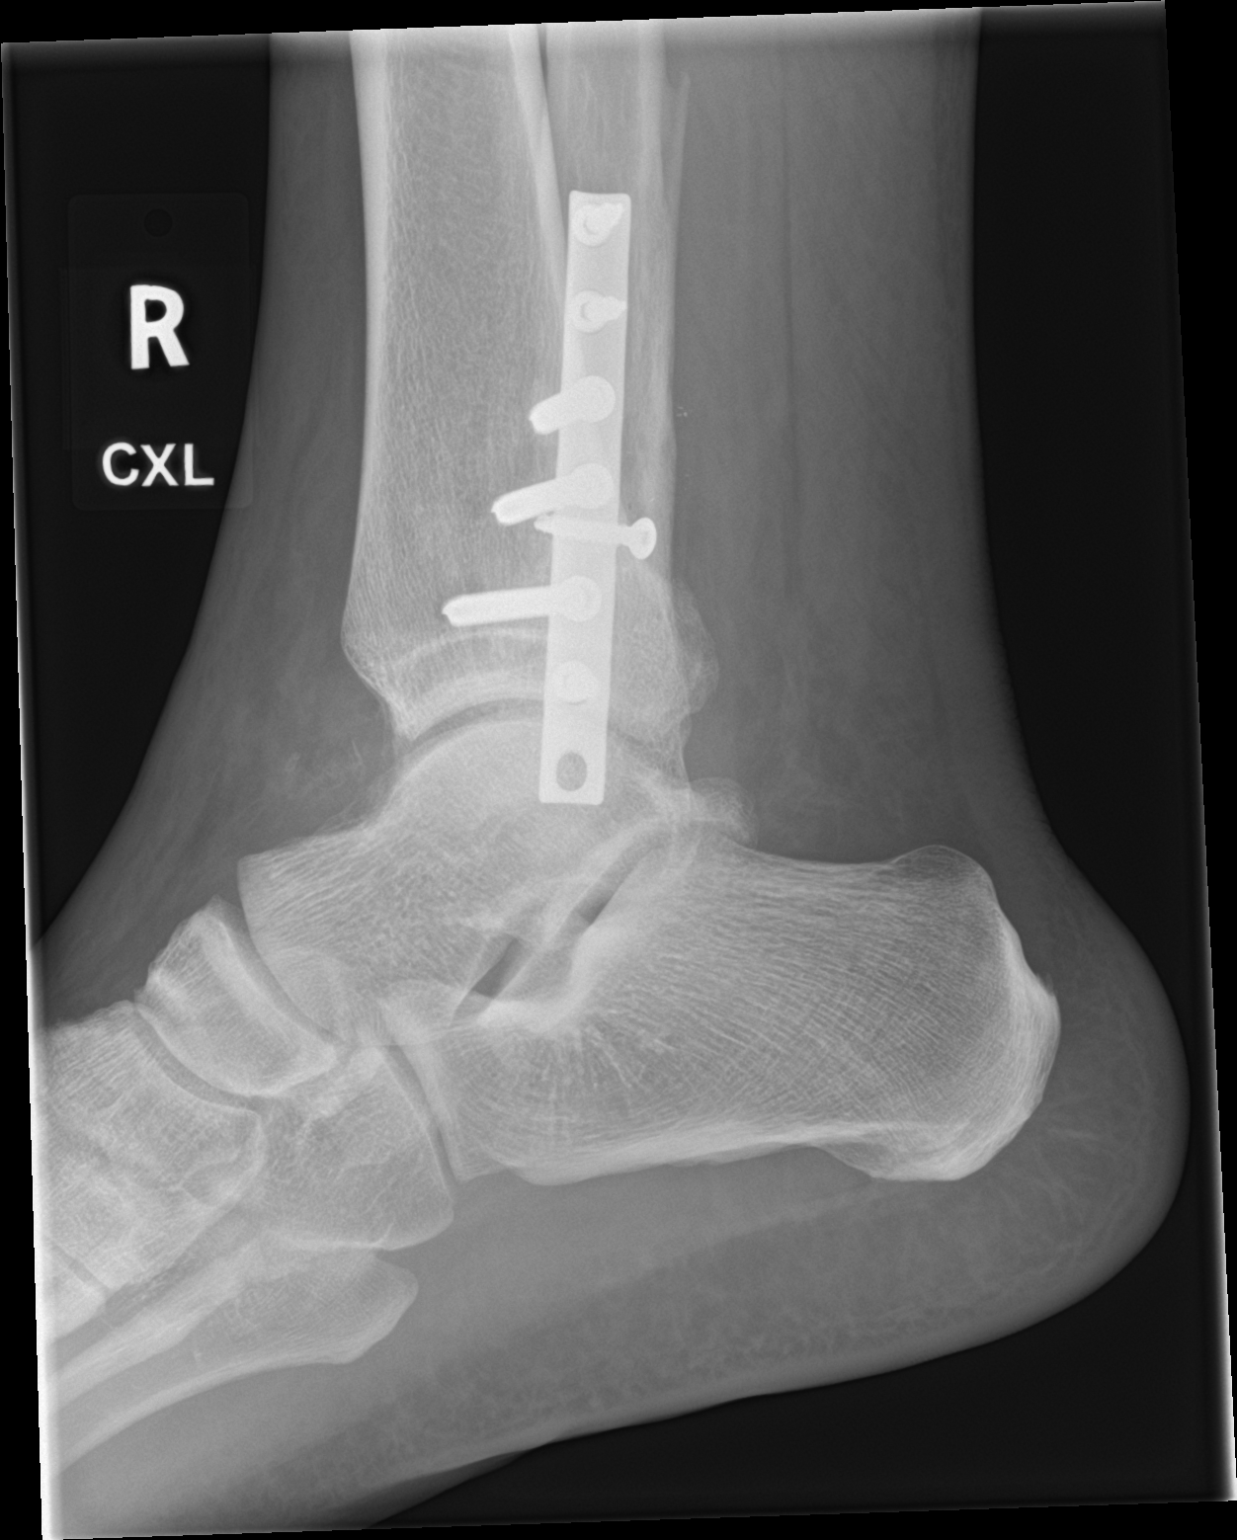

[3 of 3 positions shown; findings below may reference images not displayed]

FINDINGS: Again demonstrated is screw and plate fusion of the distal fibula
and tibia. There is interval partial bone fusion between these
bones. Mild diffuse soft tissue swelling. No fracture, dislocation
or effusion seen. There is a small amount of ill-defined calcific
density superior to the talus. There are also multiple small
calcifications or tiny metallic foreign bodies in the lateral soft
tissues.
IMPRESSION: No fracture.

## 2023-02-22 DEATH — deceased
